# Patient Record
Sex: Female | Born: 1960
Health system: Southern US, Community
[De-identification: ages and names within clinical notes are randomized; demographics above are authoritative.]

## PROBLEM LIST (undated history)

## (undated) DIAGNOSIS — E039 Hypothyroidism, unspecified: Secondary | ICD-10-CM

## (undated) DIAGNOSIS — F32A Depression, unspecified: Secondary | ICD-10-CM

## (undated) DIAGNOSIS — K5792 Diverticulitis of intestine, part unspecified, without perforation or abscess without bleeding: Secondary | ICD-10-CM

## (undated) DIAGNOSIS — F419 Anxiety disorder, unspecified: Secondary | ICD-10-CM

## (undated) DIAGNOSIS — J410 Simple chronic bronchitis: Secondary | ICD-10-CM

## (undated) DIAGNOSIS — R160 Hepatomegaly, not elsewhere classified: Secondary | ICD-10-CM

## (undated) DIAGNOSIS — K760 Fatty (change of) liver, not elsewhere classified: Secondary | ICD-10-CM

## (undated) DIAGNOSIS — N390 Urinary tract infection, site not specified: Secondary | ICD-10-CM

## (undated) DIAGNOSIS — D219 Benign neoplasm of connective and other soft tissue, unspecified: Secondary | ICD-10-CM

## (undated) DIAGNOSIS — N809 Endometriosis, unspecified: Secondary | ICD-10-CM

## (undated) DIAGNOSIS — Z860100 Personal history of colon polyps, unspecified: Secondary | ICD-10-CM

## (undated) DIAGNOSIS — J45909 Unspecified asthma, uncomplicated: Secondary | ICD-10-CM

## (undated) DIAGNOSIS — M1990 Unspecified osteoarthritis, unspecified site: Secondary | ICD-10-CM

## (undated) DIAGNOSIS — I8393 Asymptomatic varicose veins of bilateral lower extremities: Secondary | ICD-10-CM

## (undated) DIAGNOSIS — K219 Gastro-esophageal reflux disease without esophagitis: Secondary | ICD-10-CM

## (undated) DIAGNOSIS — L4050 Arthropathic psoriasis, unspecified: Secondary | ICD-10-CM

## (undated) DIAGNOSIS — F329 Major depressive disorder, single episode, unspecified: Secondary | ICD-10-CM

## (undated) DIAGNOSIS — Z8619 Personal history of other infectious and parasitic diseases: Secondary | ICD-10-CM

## (undated) DIAGNOSIS — K449 Diaphragmatic hernia without obstruction or gangrene: Secondary | ICD-10-CM

## (undated) DIAGNOSIS — I2510 Atherosclerotic heart disease of native coronary artery without angina pectoris: Principal | ICD-10-CM

## (undated) DIAGNOSIS — E079 Disorder of thyroid, unspecified: Secondary | ICD-10-CM

## (undated) DIAGNOSIS — K5730 Diverticulosis of large intestine without perforation or abscess without bleeding: Secondary | ICD-10-CM

## (undated) DIAGNOSIS — Z87440 Personal history of urinary (tract) infections: Secondary | ICD-10-CM

## (undated) DIAGNOSIS — J189 Pneumonia, unspecified organism: Secondary | ICD-10-CM

## (undated) DIAGNOSIS — E559 Vitamin D deficiency, unspecified: Secondary | ICD-10-CM

## (undated) DIAGNOSIS — I739 Peripheral vascular disease, unspecified: Secondary | ICD-10-CM

## (undated) DIAGNOSIS — J449 Chronic obstructive pulmonary disease, unspecified: Secondary | ICD-10-CM

## (undated) DIAGNOSIS — Z8742 Personal history of other diseases of the female genital tract: Secondary | ICD-10-CM

## (undated) DIAGNOSIS — L409 Psoriasis, unspecified: Secondary | ICD-10-CM

## (undated) DIAGNOSIS — K649 Unspecified hemorrhoids: Secondary | ICD-10-CM

## (undated) DIAGNOSIS — K635 Polyp of colon: Secondary | ICD-10-CM

## (undated) DIAGNOSIS — K5790 Diverticulosis of intestine, part unspecified, without perforation or abscess without bleeding: Secondary | ICD-10-CM

## (undated) DIAGNOSIS — Z9289 Personal history of other medical treatment: Secondary | ICD-10-CM

## (undated) DIAGNOSIS — I712 Thoracic aortic aneurysm, without rupture: Secondary | ICD-10-CM

## (undated) DIAGNOSIS — A630 Anogenital (venereal) warts: Secondary | ICD-10-CM

## (undated) DIAGNOSIS — Z8719 Personal history of other diseases of the digestive system: Secondary | ICD-10-CM

## (undated) DIAGNOSIS — C679 Malignant neoplasm of bladder, unspecified: Secondary | ICD-10-CM

## (undated) DIAGNOSIS — K602 Anal fissure, unspecified: Secondary | ICD-10-CM

## (undated) DIAGNOSIS — T7840XA Allergy, unspecified, initial encounter: Secondary | ICD-10-CM

## (undated) HISTORY — PX: SPINE SURGERY: SHX786

## (undated) HISTORY — DX: Hepatomegaly, not elsewhere classified: R16.0

## (undated) HISTORY — DX: Diaphragmatic hernia without obstruction or gangrene: K44.9

## (undated) HISTORY — DX: Major depressive disorder, single episode, unspecified: F32.9

## (undated) HISTORY — DX: Unspecified osteoarthritis, unspecified site: M19.90

## (undated) HISTORY — DX: Gastro-esophageal reflux disease without esophagitis: K21.9

## (undated) HISTORY — DX: Malignant neoplasm of bladder, unspecified: C67.9

## (undated) HISTORY — DX: Allergy, unspecified, initial encounter: T78.40XA

## (undated) HISTORY — DX: Atherosclerotic heart disease of native coronary artery without angina pectoris: I25.10

## (undated) HISTORY — DX: Depression, unspecified: F32.A

## (undated) HISTORY — DX: Fatty (change of) liver, not elsewhere classified: K76.0

## (undated) HISTORY — DX: Pneumonia, unspecified organism: J18.9

## (undated) HISTORY — DX: Anal fissure, unspecified: K60.2

## (undated) HISTORY — PX: TRANSURETHRAL RESECTION OF BLADDER TUMOR: SHX2575

## (undated) HISTORY — DX: Diverticulitis of intestine, part unspecified, without perforation or abscess without bleeding: K57.92

## (undated) HISTORY — DX: Benign neoplasm of connective and other soft tissue, unspecified: D21.9

## (undated) HISTORY — PX: BRONCHOSCOPY: SUR163

## (undated) HISTORY — PX: BLADDER SURGERY: SHX569

## (undated) HISTORY — PX: TONSILLECTOMY: SUR1361

## (undated) HISTORY — DX: Diverticulosis of intestine, part unspecified, without perforation or abscess without bleeding: K57.90

## (undated) HISTORY — DX: Unspecified asthma, uncomplicated: J45.909

## (undated) HISTORY — DX: Anogenital (venereal) warts: A63.0

## (undated) HISTORY — DX: Urinary tract infection, site not specified: N39.0

## (undated) HISTORY — DX: Anxiety disorder, unspecified: F41.9

## (undated) HISTORY — DX: Polyp of colon: K63.5

## (undated) HISTORY — DX: Asymptomatic varicose veins of bilateral lower extremities: I83.93

## (undated) HISTORY — DX: Peripheral vascular disease, unspecified: I73.9

## (undated) HISTORY — DX: Disorder of thyroid, unspecified: E07.9

## (undated) HISTORY — DX: Unspecified hemorrhoids: K64.9

## (undated) HISTORY — PX: COLONOSCOPY: SHX174

## (undated) HISTORY — DX: Endometriosis, unspecified: N80.9

## (undated) HISTORY — DX: Thoracic aortic aneurysm, without rupture: I71.2

---

## 2002-11-24 HISTORY — PX: VAGINAL HYSTERECTOMY: SUR661

## 2003-11-25 HISTORY — PX: LUMBAR SPINE SURGERY: SHX701

## 2008-11-24 HISTORY — PX: ESOPHAGOGASTRODUODENOSCOPY: SHX1529

## 2013-03-01 ENCOUNTER — Encounter (HOSPITAL_COMMUNITY): Payer: Self-pay

## 2013-03-01 ENCOUNTER — Other Ambulatory Visit (HOSPITAL_COMMUNITY): Payer: Self-pay | Admitting: Internal Medicine

## 2013-03-01 ENCOUNTER — Ambulatory Visit (HOSPITAL_COMMUNITY)
Admission: RE | Admit: 2013-03-01 | Discharge: 2013-03-01 | Disposition: A | Discharge status: Home / Self Care | Payer: Managed Care, Other (non HMO) | Source: Ambulatory Visit | Attending: Internal Medicine | Admitting: Internal Medicine

## 2013-03-01 DIAGNOSIS — R52 Pain, unspecified: Secondary | ICD-10-CM

## 2013-03-01 DIAGNOSIS — R109 Unspecified abdominal pain: Secondary | ICD-10-CM | POA: Insufficient documentation

## 2013-03-01 DIAGNOSIS — R935 Abnormal findings on diagnostic imaging of other abdominal regions, including retroperitoneum: Secondary | ICD-10-CM | POA: Insufficient documentation

## 2013-03-01 DIAGNOSIS — Z8551 Personal history of malignant neoplasm of bladder: Secondary | ICD-10-CM | POA: Insufficient documentation

## 2013-03-01 DIAGNOSIS — K7689 Other specified diseases of liver: Secondary | ICD-10-CM | POA: Insufficient documentation

## 2013-03-01 IMAGING — CT CT ABD-PELV W/ CM
2 of 5 series · 17 of 46 positions shown, 19 images · IV contrast (CONTRAST)
Comparison: None.

CLINICAL DATA: Left-sided abdominal pain.  History of bladder
cancer.

CT ABDOMEN AND PELVIS WITH CONTRAST
TECHNIQUE: Multidetector CT imaging of the abdomen and pelvis was
performed following the standard protocol during bolus
administration of intravenous contrast.
Contrast: 100mL OMNIPAQUE IOHEXOL 300 MG/ML  SOLN

[Series 2: routine · axial · 0.79mm/px · z∈[+390,+805]mm · 14 of 93 slices shown, 16 images]
[im 5/93  soft-tissue]
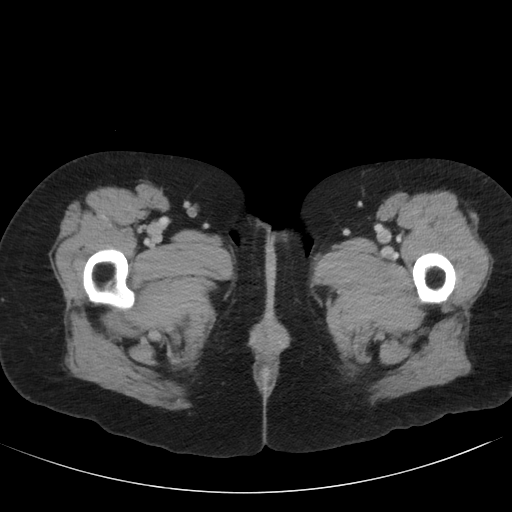
[im 5/93  bone]
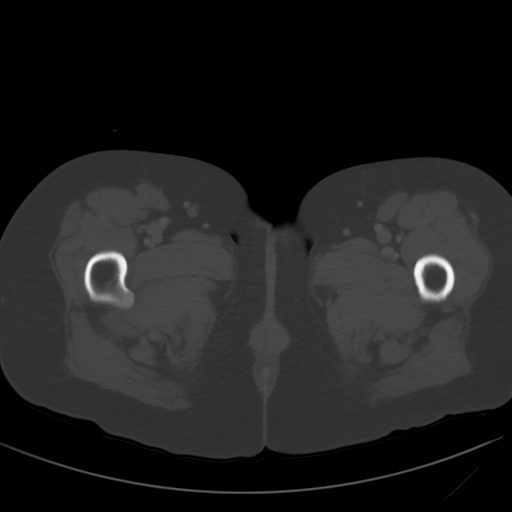
[im 10/93  soft-tissue]
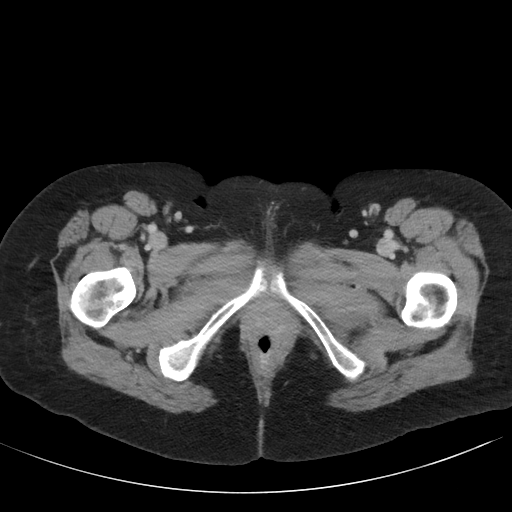
[im 20/93  soft-tissue]
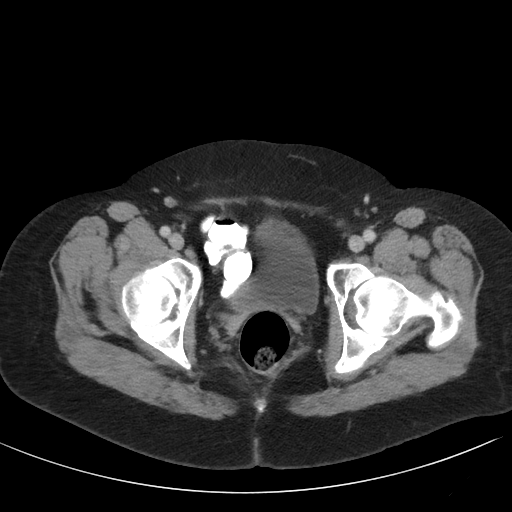
[im 25/93  soft-tissue]
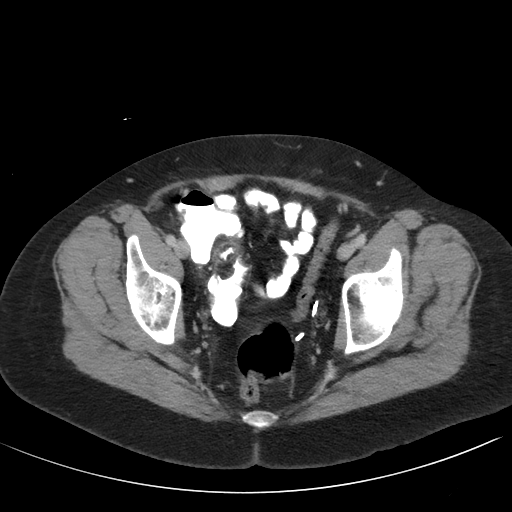
[im 30/93  soft-tissue]
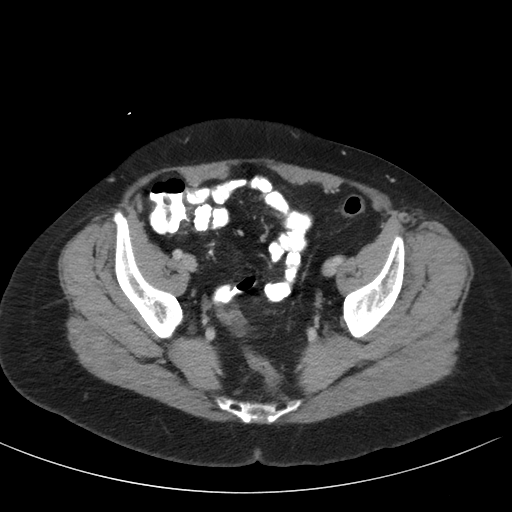
[im 39/93  soft-tissue]
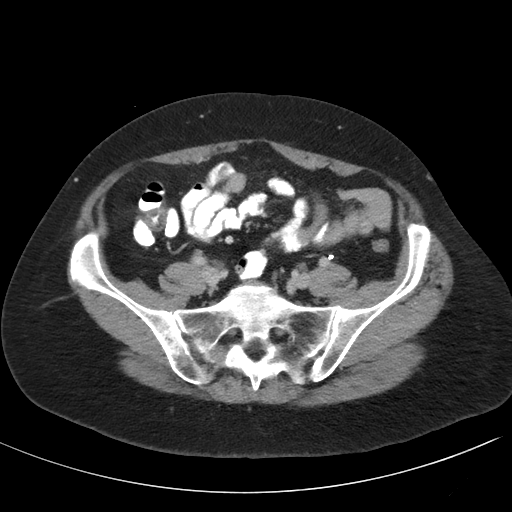
[im 44/93  soft-tissue]
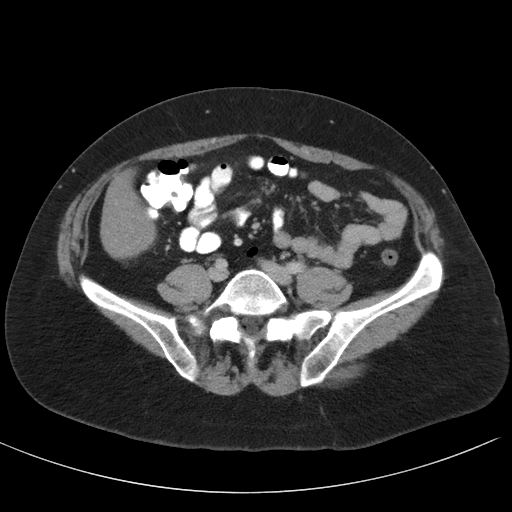
[im 49/93  soft-tissue]
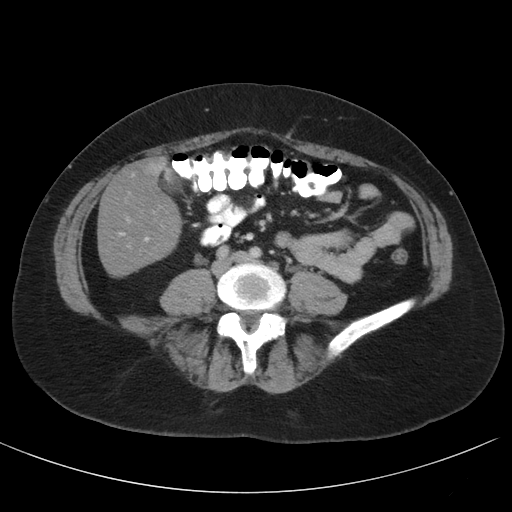
[im 54/93  soft-tissue]
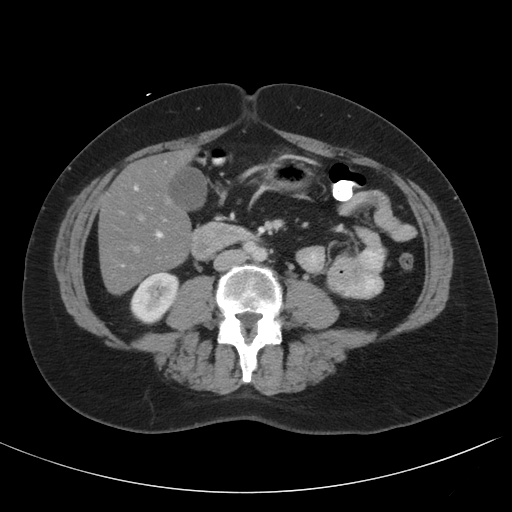
[im 54/93  bone]
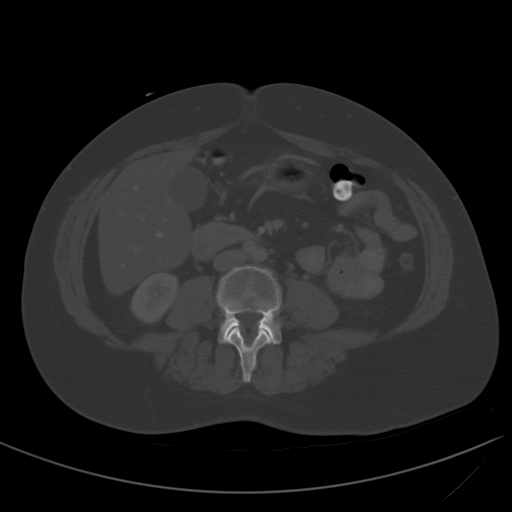
[im 63/93  soft-tissue]
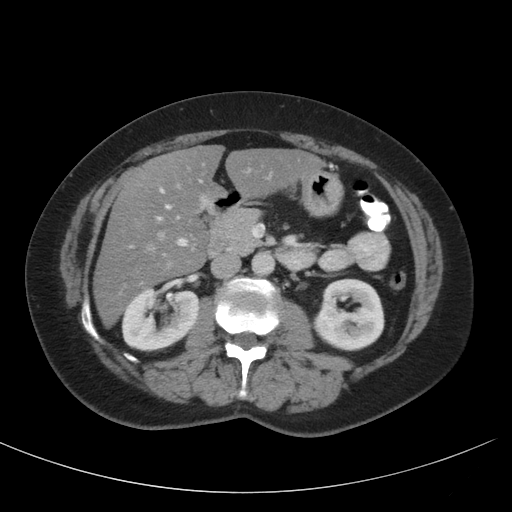
[im 68/93  soft-tissue]
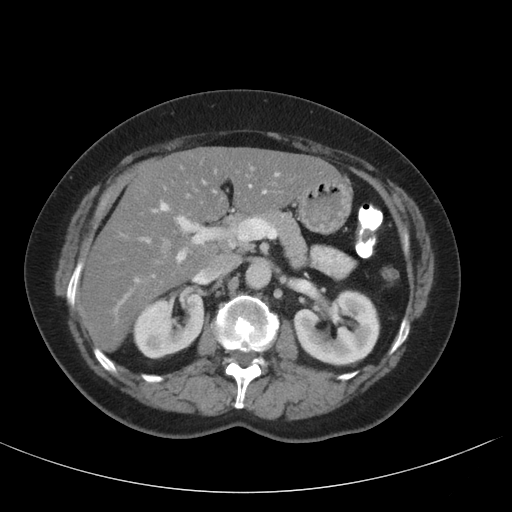
[im 73/93  soft-tissue]
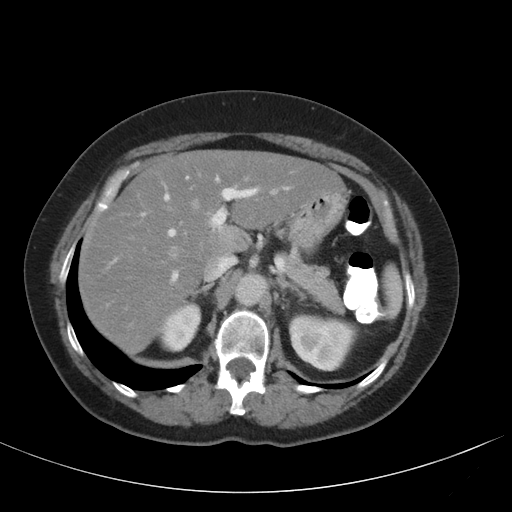
[im 83/93  soft-tissue]
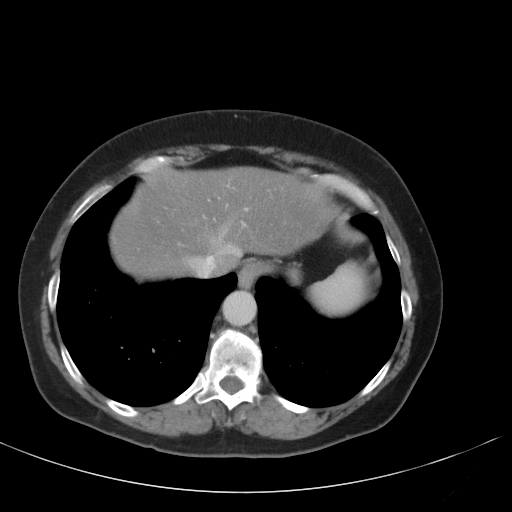
[im 88/93  soft-tissue]
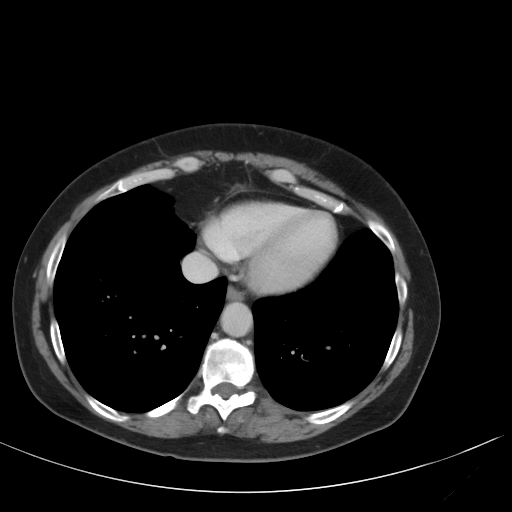

[mpr, coronals, coronal · coronal · 0.90mm/px · 3 of 96 slices shown]
[im 32/96  soft-tissue]
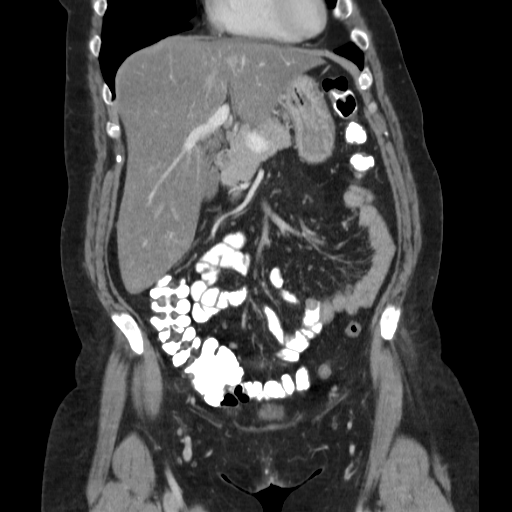
[im 43/96  soft-tissue]
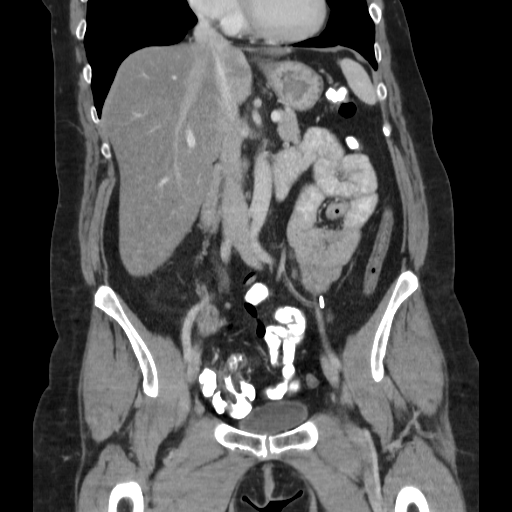
[im 53/96  soft-tissue]
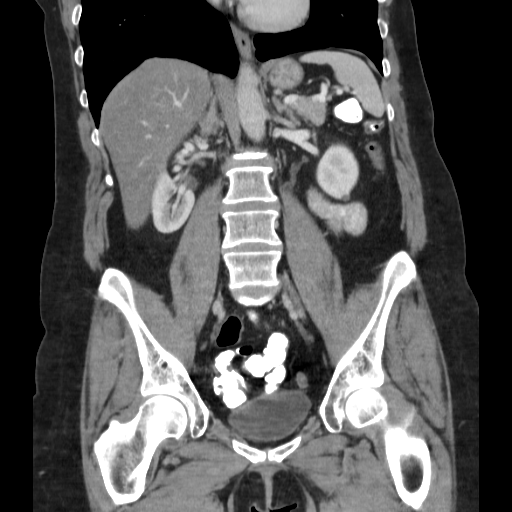

[17 of 46 positions shown; findings below may reference images not displayed]

FINDINGS: The lung bases are clear without focal nodule, mass, or
airspace disease.  The heart size is normal.  No significant
pleural or pericardial effusion is present.

There is diffuse fatty infiltration of the liver.  No discrete
lesions are evident.  The spleen is within normal limits.  The
stomach, duodenum, and pancreas are normal.  The, bowel ducts and
gallbladder within normal limits.  The adrenal glands are normal
bilaterally.  Kidneys and ureters are normal.

Rectosigmoid colon is mostly collapsed.  There is a focal area of
slight wall thickening near the splenic flexure.  Minimal stranding
is evident.  Oral contrast is noted within the more proximal colon.
Focal wall thickening is noted at terminal ileum.  The remainder of
the small bowel is unremarkable.  No significant adenopathy or free
fluid is present.

The bone windows are unremarkable.
IMPRESSION: 1.  Focal wall thickening and fatty infiltration of the terminal
ileum suggests possibility of inflammatory bowel disease.
2.  Focal wall thickening near the splenic flexure of the colon
with some inflammation could represent focal colitis or segmental
disease associated to Crohn's disease.
3.  Diffuse fatty infiltration of the liver.

## 2013-03-01 MED ORDER — IOHEXOL 300 MG/ML  SOLN
100.0000 mL | Freq: Once | INTRAMUSCULAR | Status: AC | PRN
  Administered 2013-03-01: 100 mL via INTRAVENOUS

## 2013-03-02 ENCOUNTER — Encounter: Payer: Self-pay | Admitting: Internal Medicine

## 2013-03-08 ENCOUNTER — Telehealth: Payer: Self-pay | Admitting: Internal Medicine

## 2013-03-08 NOTE — Telephone Encounter (Signed)
Left message for patient to call back  

## 2013-03-09 NOTE — Telephone Encounter (Signed)
Patient would like to see PA or RNP earlier.   She is scheduled with Malachi Bonds to come tomorrow at 10:30

## 2013-03-10 ENCOUNTER — Ambulatory Visit (INDEPENDENT_AMBULATORY_CARE_PROVIDER_SITE_OTHER): Payer: Managed Care, Other (non HMO) | Admitting: Nurse Practitioner

## 2013-03-10 ENCOUNTER — Encounter: Payer: Self-pay | Admitting: Nurse Practitioner

## 2013-03-10 VITALS — BP 120/86 | HR 81 | Ht 69.0 in | Wt 189.0 lb

## 2013-03-10 DIAGNOSIS — R197 Diarrhea, unspecified: Secondary | ICD-10-CM

## 2013-03-10 DIAGNOSIS — R933 Abnormal findings on diagnostic imaging of other parts of digestive tract: Secondary | ICD-10-CM

## 2013-03-10 MED ORDER — METHOCARBAMOL 500 MG PO TABS: 500.0000 mg | ORAL_TABLET | Freq: Two times a day (BID) | ORAL | Status: DC

## 2013-03-10 NOTE — Patient Instructions (Addendum)
You have been scheduled for a CT scan of the abdomen and pelvis at [facility] ([address]---this is in the same building as [facility]).   You are scheduled on 03/31/13  at 9 am. You should arrive 15 minutes prior to your appointment time for registration. Please follow the written instructions below on the day of your exam:  WARNING: IF YOU ARE ALLERGIC TO IODINE/X-RAY DYE, PLEASE NOTIFY RADIOLOGY IMMEDIATELY AT [phone]! YOU WILL BE GIVEN A 13 HOUR PREMEDICATION PREP.  1) Do not eat or drink anything after 5 am (4 hours prior to your test) 2) You have been given 2 bottles of oral contrast to drink. The solution may taste  better if refrigerated, but do NOT add ice or any other liquid to this solution. Shake well before drinking.    Drink 1 bottle of contrast @ 7 am (2 hours prior to your exam)  Drink 1 bottle of contrast @ 8 am (1 hour prior to your exam)  You may take any medications as prescribed with a small amount of water except for the following: Metformin, Glucophage, Glucovance, Avandamet, Riomet, Fortamet, Actoplus Met, Janumet, Glumetza or Metaglip. The above medications must be held the day of the exam AND 48 hours after the exam.  The purpose of you drinking the oral contrast is to aid in the visualization of your intestinal tract. The contrast solution may cause some diarrhea. Before your exam is started, you will be given a small amount of fluid to drink. Depending on your individual set of symptoms, you may also receive an intravenous injection of x-ray contrast/dye. Plan on being at [facility] for 30 minutes or long, depending on the type of exam you are having performed.  This test typically takes 30-45 minutes to complete.  If you have any questions regarding your exam or if you need to reschedule, you may call the CT department at [phone] between the hours of 8:00 am and 5:00 pm,  Monday-Friday.  ________________________________________________________________________ Please follow up with Dr Jarold Motto in 1 month. CC:  Alysia Penna MD

## 2013-03-10 NOTE — Progress Notes (Addendum)
HPI :  Patient is a 52 year old female, new to this practice, who relocated from [place] last summer. She has a [facility] of colon cancer in mother and has had multiple surveillance colonoscopies in the past. Her last colonoscopy, done in [facility] May 2013 for surveillance of polyp history, evaluation of constipation and rectal bleeding was normal. No polyps or masses were seen, only internal hemorrhoids.  I do not have the pathology report of previous colon polyps but that has been requested.   A few weeks ago patient developed diarrhea. She subsequently developed lower back pain after lifting bird seed. Following that she developed nausea and began to notice blood in her stool. Patient thought she had another kidney infection. She saw PCP at [facility], a CTscan with contrast was ordered and revealed a focal area of slight wall thickening near the splenic flexure and focal wall thickening at terminal ileum. PCP started her on Cipro and Flagyl which she will complete tomorrow. CBC was okay except for elevated MCV. She is feeling better., still has back pain but BMs are forming. Her typical bowel pattern is usually 1-2 formed BMs a day. She does occasionally have minimal rectal bleeding which she attributes to hemorrhoids.   Family history of colon cancer in mother diagnosed at 33 years of age.  Past Medical History  Diagnosis Date  . Cancer     bladder  . Anal fissure   . Anxiety   . Colon polyps   . Depression   . Anxiety   . Diverticulosis   . Diverticulitis   . GERD (gastroesophageal reflux disease)   . Thyroid disease   . Pneumonia   . UTI (lower urinary tract infection)    Family History  Problem Relation Age of Onset  . Breast cancer Maternal Grandmother   . Colon cancer Maternal Aunt 50  . Cancer Paternal Aunt     ?   History  Substance Use Topics  . Smoking status: Current Every Day Smoker -- 1.00 packs/day    Types: Cigarettes  . Smokeless tobacco: Never  Used  . Alcohol Use: No   Current Outpatient Prescriptions  Medication Sig Dispense Refill  . cetirizine (ZYRTEC) 10 MG tablet Take 10 mg by mouth daily.      . ciprofloxacin (CIPRO) 500 MG/5ML (10%) suspension Take 500 mg by mouth 2 (two) times daily.      . clonazePAM (KLONOPIN) 0.5 MG tablet Take 0.5 mg by mouth 2 (two) times daily as needed for anxiety.      . Levothyroxine Sodium (SYNTHROID PO) Take by mouth. As directed not sure of the strength      . metroNIDAZOLE (FLAGYL) 500 MG tablet Take 500 mg by mouth 3 (three) times daily.       No current facility-administered medications for this visit.   Allergies  Allergen Reactions  . Chantix (Varenicline) Hives    Rash on breast  . Penicillins Hives  . Sulfa Antibiotics Hives    Review of Systems: All systems reviewed and negative except where noted in HPI.    Physical Exam: BP 120/86  Pulse 81  Ht 5\' 9"  (1.753 m)  Wt 189 lb (85.73 kg)  BMI 27.9 kg/m2 Constitutional: Well-developed, white female in no acute distress. HEENT: Normocephalic and atraumatic. Conjunctivae are normal. No scleral icterus. Neck supple.  Cardiovascular: Normal rate, regular rhythm.  Pulmonary/chest: Effort normal and breath sounds normal. No wheezing, rales or rhonchi. Abdominal: Soft, nondistended, nontender. Bowel sounds active throughout. There  are no masses palpable. No hepatomegaly. Extremities: no edema Lymphadenopathy: No cervical adenopathy noted. Neurological: Alert and oriented to person place and time. Skin: Skin is warm and dry. No rashes noted. Psychiatric: Normal mood and affect. Behavior is normal.   ASSESSMENT AND PLAN:  1. Acute illness with nausea, diarrhea and left side / left lower back pain. CTscan reveals focal area of slight wall thickening near the splenic flexure and focal wall thickening at terminal ileum. Diarrhea resolving with antibiotics. Her left lower back / side pain may be musculoskeletal after picking up  birdseed. Will try her on a low dose muscle relaxer so see if this helps. She is just 11 months out from a normal colonoscopy. Suspect infectious process which is resolving. Will repeat CTscan in 3 weeks. .  .  2. [facility] of colon cancer in mother at age 63. Patient had a normal surveillance colonoscopy in [place] 11 months ago. Report to be scanned.   3. History of colon polyps. We called two different endoscopy centers today to get get path results but no one seems to have them. Patient will attempt to get path reports herself  4. GERD, her last EGD date is unknown. She has only occasional symptoms so not on daily PPI.   5. Fatty liver disease  Addendum 03/17/12:   Records received from previous gastroenterology group. Briefly, patient had   colonoscopy with polypectomy January 2003. Pathology compatible with tubular adenoma.  Colonoscopy August 2005 no polyps. Hemorrhoids and proctitis found. Biopsies compatible with mild chronic nonspecific proctitis.  Colonoscopy with biopsy December 2006 with findings of mild lower proctitis. Rectal biopsies negative.  EGD with biopsy December 2006 for abdominal pain. Exam normal antum  Biopsies revealed mild chronic gastritis without H. Pylori.  EGD with biopsy January 2009 with findings of thickened gastric folds. Biopsies were negative.  Colonoscopy March 2010 done for rectal bleeding and" history of colon cancer". Of note, the patient's mother had colon cancer, patient has no personal history of colon cancer. No polyps or masses seen on this colonoscopy.  Colonoscopy may 2013 for constipation rectal bleeding. The extent of exam to the cecum. Internal hemorrhoids were not bleeding nor inflamed. No polyps seen. Exam normal.  Transurethral resection of bladder tumor November 2003. 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  Records to be scanned in media

## 2013-03-13 ENCOUNTER — Encounter: Payer: Self-pay | Admitting: Nurse Practitioner

## 2013-03-14 ENCOUNTER — Encounter: Payer: Self-pay | Admitting: *Deleted

## 2013-03-14 ENCOUNTER — Telehealth: Payer: Self-pay | Admitting: Nurse Practitioner

## 2013-03-15 NOTE — Telephone Encounter (Signed)
Pt called to let us know that her diarrhea has come back. Pt wanted to know if it was ok for her to continue taking align and wanted to know if she could take pepto. Discussed with pt that it is fine for her to take the align and that we recommend taking Imodium for diarrhea. Pt just wanted to let us know that the diarrhea did come back. Willette Cluster NP notified.

## 2013-03-31 ENCOUNTER — Ambulatory Visit (INDEPENDENT_AMBULATORY_CARE_PROVIDER_SITE_OTHER)
Admission: RE | Admit: 2013-03-31 | Discharge: 2013-03-31 | Disposition: A | Discharge status: Home / Self Care | Payer: Managed Care, Other (non HMO) | Source: Ambulatory Visit | Attending: Nurse Practitioner | Admitting: Nurse Practitioner

## 2013-03-31 DIAGNOSIS — R933 Abnormal findings on diagnostic imaging of other parts of digestive tract: Secondary | ICD-10-CM

## 2013-03-31 DIAGNOSIS — R197 Diarrhea, unspecified: Secondary | ICD-10-CM

## 2013-03-31 MED ORDER — IOHEXOL 300 MG/ML  SOLN
100.0000 mL | Freq: Once | INTRAMUSCULAR | Status: AC | PRN
  Administered 2013-03-31: 100 mL via INTRAVENOUS

## 2013-04-04 ENCOUNTER — Ambulatory Visit: Payer: Managed Care, Other (non HMO) | Admitting: Internal Medicine

## 2013-04-06 ENCOUNTER — Other Ambulatory Visit: Payer: Self-pay | Admitting: Internal Medicine

## 2013-04-06 DIAGNOSIS — Z1231 Encounter for screening mammogram for malignant neoplasm of breast: Secondary | ICD-10-CM

## 2013-04-07 ENCOUNTER — Encounter: Payer: Self-pay | Admitting: *Deleted

## 2013-04-12 ENCOUNTER — Encounter: Payer: Self-pay | Admitting: Gastroenterology

## 2013-04-12 ENCOUNTER — Ambulatory Visit (INDEPENDENT_AMBULATORY_CARE_PROVIDER_SITE_OTHER): Payer: Managed Care, Other (non HMO) | Admitting: Gastroenterology

## 2013-04-12 VITALS — BP 120/90 | HR 90 | Ht 69.0 in | Wt 189.0 lb

## 2013-04-12 DIAGNOSIS — R1012 Left upper quadrant pain: Secondary | ICD-10-CM

## 2013-04-12 DIAGNOSIS — R197 Diarrhea, unspecified: Secondary | ICD-10-CM

## 2013-04-12 DIAGNOSIS — Z860100 Personal history of colon polyps, unspecified: Secondary | ICD-10-CM

## 2013-04-12 DIAGNOSIS — R52 Pain, unspecified: Secondary | ICD-10-CM

## 2013-04-12 NOTE — Patient Instructions (Signed)
Your physician has requested that you go to the basement for the following lab work before leaving today: IFOB test

## 2013-04-12 NOTE — Progress Notes (Signed)
This is a 52 year old Caucasian female status post episode of severe left upper quadrant pain radiating into her back with associated diarrhea treated by primary care physician with Cipro and metronidazole.  CT scan showed thickening of her splenic flexure of the colon, but otherwise was unremarkable.  She is status post multiple colonoscopies in [place], and is up-to-date on her colonoscopy exams.  She has multiple urologic problems and had cystoscopy 1 month before her most recent episode of abdominal pain which currently with several weeks ago.  At this time she is free of any pain, rectal bleeding, or bowel or regularity.  She denies any systemic complaints, use of narcotics, but is a constricted medications, or any episodes of dehydration.  Allegedly, she has been treated in the past for diverticulitis.  Current Medications, Allergies, Past Medical History, Past Surgical History, Family History and Social History were reviewed in [employer] record.  ROS: All systems were reviewed and are negative unless otherwise stated in the HPI.          Physical Exam: The appearing patient in no distress.  Blood pressure and 30/66, pulse 67 and regular and weight 161.  Her abdomen shows some obesity but no distention, organomegaly, masses or tenderness.  Bowel sounds are normal.  There is no CVA tenderness.  Mental status is normal    Assessment and Plan: Probable resolved ischemic colitis, etiology unclear.  I do not think this patient has underlying inflammatory bowel disease, it is unlikely that she had diverticulitis with such an unusual acute presentation.  I've asked her to do IFOB stool cards, and to call us immediately if she has recurrence of her problem that would require followup colonoscopy.  Followup CT scan on May 8 this year a completely cleared as opposed his CT scan on April 8.  Scans reviewed and do show fatty infiltration of the liver is some  atherogenesis. Encounter Diagnosis  Name Primary?  . Diarrhea Yes

## 2013-04-21 ENCOUNTER — Other Ambulatory Visit (HOSPITAL_COMMUNITY): Payer: Managed Care, Other (non HMO)

## 2013-04-26 ENCOUNTER — Ambulatory Visit (HOSPITAL_COMMUNITY): Payer: Managed Care, Other (non HMO) | Attending: Cardiology | Admitting: Radiology

## 2013-04-26 ENCOUNTER — Other Ambulatory Visit (HOSPITAL_COMMUNITY): Payer: Self-pay | Admitting: Internal Medicine

## 2013-04-26 ENCOUNTER — Other Ambulatory Visit (INDEPENDENT_AMBULATORY_CARE_PROVIDER_SITE_OTHER): Payer: Managed Care, Other (non HMO)

## 2013-04-26 DIAGNOSIS — I712 Thoracic aortic aneurysm, without rupture, unspecified: Secondary | ICD-10-CM

## 2013-04-26 DIAGNOSIS — R197 Diarrhea, unspecified: Secondary | ICD-10-CM

## 2013-04-26 NOTE — Progress Notes (Signed)
Echocardiogram performed.  

## 2013-04-27 ENCOUNTER — Encounter (HOSPITAL_COMMUNITY): Payer: Self-pay | Admitting: Internal Medicine

## 2013-05-11 ENCOUNTER — Ambulatory Visit
Admission: RE | Admit: 2013-05-11 | Discharge: 2013-05-11 | Disposition: A | Discharge status: Home / Self Care | Payer: Managed Care, Other (non HMO) | Source: Ambulatory Visit | Attending: Internal Medicine | Admitting: Internal Medicine

## 2013-05-11 DIAGNOSIS — Z1231 Encounter for screening mammogram for malignant neoplasm of breast: Secondary | ICD-10-CM

## 2013-10-18 ENCOUNTER — Other Ambulatory Visit: Payer: Self-pay | Admitting: Internal Medicine

## 2013-10-18 DIAGNOSIS — R7989 Other specified abnormal findings of blood chemistry: Secondary | ICD-10-CM

## 2013-10-26 ENCOUNTER — Ambulatory Visit
Admission: RE | Admit: 2013-10-26 | Discharge: 2013-10-26 | Disposition: A | Discharge status: Home / Self Care | Payer: BC Managed Care – PPO | Source: Ambulatory Visit | Attending: Internal Medicine | Admitting: Internal Medicine

## 2013-10-26 DIAGNOSIS — R7989 Other specified abnormal findings of blood chemistry: Secondary | ICD-10-CM

## 2013-11-09 ENCOUNTER — Encounter: Payer: Self-pay | Admitting: Certified Nurse Midwife

## 2013-11-09 ENCOUNTER — Ambulatory Visit (INDEPENDENT_AMBULATORY_CARE_PROVIDER_SITE_OTHER): Payer: BC Managed Care – PPO | Admitting: Certified Nurse Midwife

## 2013-11-09 VITALS — BP 110/70 | HR 72 | Resp 16 | Ht 68.25 in | Wt 190.0 lb

## 2013-11-09 DIAGNOSIS — Z01419 Encounter for gynecological examination (general) (routine) without abnormal findings: Secondary | ICD-10-CM

## 2013-11-09 DIAGNOSIS — Z8551 Personal history of malignant neoplasm of bladder: Secondary | ICD-10-CM

## 2013-11-09 DIAGNOSIS — Z0000 Encounter for general adult medical examination without abnormal findings: Secondary | ICD-10-CM

## 2013-11-09 LAB — POCT URINALYSIS DIPSTICK
Bilirubin, UA: NEGATIVE
Blood, UA: NEGATIVE
Glucose, UA: NEGATIVE
Ketones, UA: NEGATIVE
Urobilinogen, UA: NEGATIVE

## 2013-11-09 NOTE — Progress Notes (Addendum)
52 y.o. [id number] Married Caucasian Fe here to establish gyn care and  for annual exam. Menopausal no HRT. Vaginal hysterectomy in 2004 due to fibroids and bleeding one ovary retained. History of bladder cancer, resolved with susrgery. Occasional hot flash or night sweats. Some vaginal dryness use OTC without problems, limited sexually active, spouse disabled no problems. Patient complaining of rash external vaginal area and inside groin area. PCP suggested she use her steroid cream which is helping.Denies new personal products. Patient did walk with tight pants for several area on trip to [place]. Denies any other problems today.  Patient's last menstrual period was 11/24/2002.          Sexually active: yes  The current method of family planning is status post hysterectomy.    Exercising: no  exercise Smoker:  yes  Health Maintenance: Pap: 3/13 no abnormal pap smears MMG: 10/14 negative Colonoscopy: 4/13 negative repeat 3 years BMD: 2009 normal TDaP: 10/13 Labs: Poct urine-neg Self breast exam: not done   reports that she has been smoking Cigarettes.  She has been smoking about 1.00 pack per day. She has never used smokeless tobacco. She reports that she does not drink alcohol or use illicit drugs.  Past Medical History  Diagnosis Date  . Bladder cancer   . Anal fissure   . Colon polyps     TUBULAR ADENOMA   . Depression   . Anxiety   . Diverticulosis   . Diverticulitis   . GERD (gastroesophageal reflux disease)   . Thyroid disease   . Pneumonia   . UTI (lower urinary tract infection)   . Asthma   . Fatty liver   . Endometriosis   . Fibroid   . Genital warts     Past Surgical History  Procedure Laterality Date  . Cesarean section    . Spine surgery    . Tonsillectomy    . Vaginal hysterectomy  2004  . Bronchoscopy      Current Outpatient Prescriptions  Medication Sig Dispense Refill  . Betamethasone Valerate 0.12 % foam as needed.      .  calcipotriene-betamethasone (TACLONEX) ointment as needed.      . cefdinir (OMNICEF) 300 MG capsule daily.      . cetirizine (ZYRTEC) 10 MG tablet Take 10 mg by mouth daily.      . Cholecalciferol (VITAMIN D-3 PO) Take 1 tablet by mouth daily.      . clonazePAM (KLONOPIN) 0.5 MG tablet Take 0.5 mg by mouth 2 (two) times daily as needed for anxiety.      . Cyanocobalamin (VITAMIN B-12 CR PO) Take 1 tablet by mouth daily.      Marland Kitchen levothyroxine (SYNTHROID, LEVOTHROID) 75 MCG tablet Take 75 mcg by mouth daily before breakfast.      . ondansetron (ZOFRAN) 4 MG tablet as needed.      Marland Kitchen PROAIR HFA 108 (90 BASE) MCG/ACT inhaler as needed.      . zolpidem (AMBIEN) 10 MG tablet as needed.       No current facility-administered medications for this visit.    Family History  Problem Relation Age of Onset  . Breast cancer Maternal Grandmother   . Stroke Maternal Grandmother   . Colon cancer Mother   . Cancer Mother     liver  . Hypertension Father   . Thyroid disease Father   . Heart attack Maternal Grandfather     ROS:  Pertinent items are noted in HPI.  Otherwise, a comprehensive  ROS was negative.  Exam:   BP 110/70  Pulse 72  Resp 16  Ht 5' 8.25" (1.734 m)  Wt 190 lb (86.183 kg)  BMI 28.66 kg/m2  LMP 11/24/2002 Height: 5' 8.25" (173.4 cm)  Ht Readings from Last 3 Encounters:  11/09/13 5' 8.25" (1.734 m)  04/12/13 5\' 9"  (1.753 m)  03/10/13 5\' 9"  (1.753 m)    General appearance: alert, cooperative and appears stated age Head: Normocephalic, without obvious abnormality, atraumatic Neck: no adenopathy, supple, symmetrical, trachea midline and thyroid normal to inspection and palpation and non-palpable Lungs: clear to auscultation bilaterally Breasts: normal appearance, no masses or tenderness, No nipple retraction or dimpling, No nipple discharge or bleeding, No axillary or supraclavicular adenopathy Heart: regular rate and rhythm Abdomen: soft, non-tender; no masses,  no  organomegaly Extremities: extremities normal, atraumatic, no cyanosis or edema Skin: Skin color, texture, turgor normal. No rashes or lesions Lymph nodes: Cervical, supraclavicular, and axillary nodes normal. No abnormal inguinal nodes palpated Neurologic: Grossly normal   Pelvic: External genitalia:  no lesions, no redness or tenderness  Inside creases to groin and inner thighs resolving dry skin appearance, slightly pink, non tender, no lesions.              Urethra:  normal appearing urethra with no masses, tenderness or lesions              Bartholin's and Skene's: normal                 Vagina: normal appearing vagina with normal color and discharge, no lesions              Cervix: absent              Pap taken: yes patient request ( patient given current guidelines, but had history of genital warts) Bimanual Exam:  Uterus:  uterus absent              Adnexa: normal adnexa and no mass, fullness, tenderness               Rectovaginal: Confirms               Anus:  normal sphincter tone, no lesions  A:  Well Woman with normal exam  Menopausal no HRT s/p TVH with ovaries retained due to fibroids, bleeding  History of bladder cancer, resolved with surgery  History of aortic aneurysm under follow up  History of genital warts, no abnormal pap smears  Hypothyroid stable medication with PCP management  Dry skin on inner thighs resolving P:   Reviewed health and wellness pertinent to exam  Discussed if notes return of genital warts or unusual appearance on vulva needs OV  Continue follow up as indicated  Pap smear as per guidelines   Mammogram yearly pap smear taken today with reflex counseled on breast self exam, mammography screening, adequate intake of calcium and vitamin D, diet and exercise  return annually or prn  An After Visit Summary was printed and given to the patient.   40 minutes spent with patient with >50% of time spent in face to face counseling.

## 2013-11-09 NOTE — Patient Instructions (Signed)

## 2013-11-10 NOTE — Progress Notes (Signed)
Reviewed personally.  M. Suzanne Victorian Gunn, MD.  

## 2013-11-10 NOTE — Addendum Note (Signed)
Addended by: Verner Chol on: 11/10/2013 05:27 PM   Modules accepted: Level of Service

## 2013-11-10 NOTE — Progress Notes (Signed)
Reviewed personally.  M. Suzanne Conya Ellinwood, MD.  

## 2013-12-05 LAB — IPS PAP TEST WITH REFLEX TO HPV

## 2014-01-24 ENCOUNTER — Other Ambulatory Visit: Payer: Self-pay | Admitting: Internal Medicine

## 2014-01-24 DIAGNOSIS — R911 Solitary pulmonary nodule: Secondary | ICD-10-CM

## 2014-01-24 DIAGNOSIS — I712 Thoracic aortic aneurysm, without rupture, unspecified: Secondary | ICD-10-CM

## 2014-02-01 ENCOUNTER — Ambulatory Visit
Admission: RE | Admit: 2014-02-01 | Discharge: 2014-02-01 | Disposition: A | Discharge status: Home / Self Care | Payer: BC Managed Care – PPO | Source: Ambulatory Visit | Attending: Internal Medicine | Admitting: Internal Medicine

## 2014-02-01 DIAGNOSIS — R911 Solitary pulmonary nodule: Secondary | ICD-10-CM

## 2014-02-01 DIAGNOSIS — I712 Thoracic aortic aneurysm, without rupture, unspecified: Secondary | ICD-10-CM

## 2014-02-01 MED ORDER — IOHEXOL 350 MG/ML SOLN
80.0000 mL | Freq: Once | INTRAVENOUS | Status: AC | PRN
  Administered 2014-02-01: 80 mL via INTRAVENOUS

## 2014-03-27 ENCOUNTER — Ambulatory Visit (HOSPITAL_COMMUNITY)
Admission: RE | Admit: 2014-03-27 | Discharge: 2014-03-27 | Disposition: A | Discharge status: Home / Self Care | Payer: BC Managed Care – PPO | Source: Ambulatory Visit | Attending: Surgery | Admitting: Surgery

## 2014-03-27 ENCOUNTER — Other Ambulatory Visit (HOSPITAL_COMMUNITY): Payer: Self-pay | Admitting: Internal Medicine

## 2014-03-27 DIAGNOSIS — M79609 Pain in unspecified limb: Secondary | ICD-10-CM | POA: Insufficient documentation

## 2014-03-27 NOTE — Progress Notes (Signed)
Preliminary report given verbally to Dr. Ardeth Perfect at [facility]

## 2014-05-25 ENCOUNTER — Other Ambulatory Visit: Payer: Self-pay

## 2014-05-25 DIAGNOSIS — Z1231 Encounter for screening mammogram for malignant neoplasm of breast: Secondary | ICD-10-CM

## 2014-06-07 ENCOUNTER — Ambulatory Visit
Admission: RE | Admit: 2014-06-07 | Discharge: 2014-06-07 | Disposition: A | Discharge status: Home / Self Care | Payer: BC Managed Care – PPO | Source: Ambulatory Visit

## 2014-06-07 DIAGNOSIS — Z1231 Encounter for screening mammogram for malignant neoplasm of breast: Secondary | ICD-10-CM

## 2014-09-20 ENCOUNTER — Telehealth: Payer: Self-pay | Admitting: Certified Nurse Midwife

## 2014-09-20 NOTE — Telephone Encounter (Signed)
Pt states she has been having bleeding or something when she uses the bathroom pt also states it feels like it is pinching. Has been going on for about 4 or 5 days. Went to pcp and was told to come here

## 2014-09-20 NOTE — Telephone Encounter (Signed)
Patient states that she began to notice "a dark color when I wipe. It almost looks like brown old blood. I have had a hysterectomy but I have one ovary." Patient was seen at PCP who recommended that patient be seen here with Felicia Leonard CNM. States that she has also been having "pressure at the entrance of my vagina and some lower back back on the right side." Patient was checked for UTI at PCP and states "There was some trace blood but he told me there were no signs of an infection of the bladder or kidney. I am going to see a bladder doctor as well as seeing Debbi though just in case it is not vaginal." States this began five days ago. Appointment scheduled for tomorrow at 11:15am with Felicia Leonard CNM. Patient is agreeable to date and time.   Routing to provider for final review. Patient agreeable to disposition. Will close encounter

## 2014-09-21 ENCOUNTER — Ambulatory Visit (INDEPENDENT_AMBULATORY_CARE_PROVIDER_SITE_OTHER): Payer: BC Managed Care – PPO | Admitting: Certified Nurse Midwife

## 2014-09-21 ENCOUNTER — Encounter: Payer: Self-pay | Admitting: Certified Nurse Midwife

## 2014-09-21 VITALS — BP 106/70 | HR 70 | Resp 16 | Ht 68.25 in | Wt 190.0 lb

## 2014-09-21 DIAGNOSIS — B379 Candidiasis, unspecified: Secondary | ICD-10-CM

## 2014-09-21 DIAGNOSIS — N952 Postmenopausal atrophic vaginitis: Secondary | ICD-10-CM

## 2014-09-21 MED ORDER — TERCONAZOLE 0.4 % VA CREA: 1.0000 | TOPICAL_CREAM | Freq: Every day | VAGINAL | Status: DC

## 2014-09-21 NOTE — Progress Notes (Signed)
53 y.o.married white female g2p2002 here with complaint of vaginal symptoms of increase discharge with brown color on tissue.  Sexual activity was painful and noted this after occurrence 2 weeks ago. Describes discharge as clear to brown. Patient was not sure it was coming from vagina or urine,History of bladder cancer, had urine check and culture with PCP 2 days ago Normal.Onset of symptoms 5 days ago. Denies new personal products. Has noted some vaginal dryness and has not used any products to help with. No STD concerns. Urinary symptoms none that have changed. Marland Kitchen  O:Healthy female WDWN Affect: normal, orientation x 3  Exam: Abdomen:soft, non tender Lymph node: no enlargement or tenderness Pelvic exam: External genital:  BUS: negative Vagina: scant whited discharge noted. Ph:4.5   ,Wet prep taken, no blood noted. Tissue atrophic appearance, with thinning noted Cervix: absent Uterus: absent Adnexa:not palpable, no masses or fullness, non tender  Wet Prep results:positive for yeast  A:Yeast vaginitis Atrophic vaginitis History of bladder cancer   P:Discussed findings of yeast vaginitis and etiology. Discussed Aveeno  sitz bath for comfort. Discussed vaginal dryness can be causative agent with decrease normal flora in vagina. Questions addressed Rx: Terazol 7 see order with instructions Discussed vaginal dryness and treatment options. Patient prefers OTC trial. Patient will start treatment once she has completed Terazol use. She will advise if there is no change and schedule OV.  Keep appointment with urology for follow up.  Rv prn, aex

## 2014-09-21 NOTE — Progress Notes (Signed)
Reviewed personally.  M. Suzanne Dandre Sisler, MD.  

## 2014-09-21 NOTE — Patient Instructions (Signed)

## 2014-09-25 ENCOUNTER — Encounter: Payer: Self-pay | Admitting: Certified Nurse Midwife

## 2014-10-05 ENCOUNTER — Other Ambulatory Visit (INDEPENDENT_AMBULATORY_CARE_PROVIDER_SITE_OTHER): Payer: BC Managed Care – PPO

## 2014-10-05 ENCOUNTER — Encounter: Payer: Self-pay | Admitting: Nurse Practitioner

## 2014-10-05 ENCOUNTER — Ambulatory Visit (INDEPENDENT_AMBULATORY_CARE_PROVIDER_SITE_OTHER): Payer: BC Managed Care – PPO | Admitting: Nurse Practitioner

## 2014-10-05 VITALS — BP 110/78 | HR 72 | Ht 68.25 in | Wt 188.8 lb

## 2014-10-05 DIAGNOSIS — R109 Unspecified abdominal pain: Secondary | ICD-10-CM

## 2014-10-05 DIAGNOSIS — G8929 Other chronic pain: Secondary | ICD-10-CM

## 2014-10-05 DIAGNOSIS — K625 Hemorrhage of anus and rectum: Secondary | ICD-10-CM

## 2014-10-05 DIAGNOSIS — R194 Change in bowel habit: Secondary | ICD-10-CM

## 2014-10-05 LAB — CBC WITH DIFFERENTIAL/PLATELET
Basophils Absolute: 0.1 10*3/uL (ref 0.0–0.1)
Basophils Relative: 0.8 % (ref 0.0–3.0)
Eosinophils Absolute: 0.2 10*3/uL (ref 0.0–0.7)
Eosinophils Relative: 2.1 % (ref 0.0–5.0)
HCT: 45.1 % (ref 36.0–46.0)
Hemoglobin: 15.3 g/dL — ABNORMAL HIGH (ref 12.0–15.0)
Lymphocytes Relative: 32.1 % (ref 12.0–46.0)
Lymphs Abs: 2.7 10*3/uL (ref 0.7–4.0)
MCHC: 33.9 g/dL (ref 30.0–36.0)
MCV: 89.3 fl (ref 78.0–100.0)
Monocytes Absolute: 0.6 10*3/uL (ref 0.1–1.0)
Monocytes Relative: 7.2 % (ref 3.0–12.0)
Neutro Abs: 4.8 10*3/uL (ref 1.4–7.7)
Neutrophils Relative %: 57.8 % (ref 43.0–77.0)
Platelets: 234 10*3/uL (ref 150.0–400.0)
RBC: 5.05 Mil/uL (ref 3.87–5.11)
RDW: 12.8 % (ref 11.5–15.5)
WBC: 8.4 10*3/uL (ref 4.0–10.5)

## 2014-10-05 LAB — COMPREHENSIVE METABOLIC PANEL
ALT: 49 U/L — AB (ref 0–35)
AST: 37 U/L (ref 0–37)
Albumin: 3.7 g/dL (ref 3.5–5.2)
Alkaline Phosphatase: 85 U/L (ref 39–117)
BUN: 10 mg/dL (ref 6–23)
CALCIUM: 9.7 mg/dL (ref 8.4–10.5)
CHLORIDE: 106 meq/L (ref 96–112)
CO2: 19 mEq/L (ref 19–32)
Creatinine, Ser: 0.8 mg/dL (ref 0.4–1.2)
GFR: 85.94 mL/min (ref >60.00)
Glucose, Bld: 98 mg/dL (ref 70–99)
Potassium: 4.5 mEq/L (ref 3.5–5.1)
SODIUM: 139 meq/L (ref 135–145)
TOTAL PROTEIN: 7.4 g/dL (ref 6.0–8.3)
Total Bilirubin: 0.5 mg/dL (ref 0.2–1.2)

## 2014-10-05 LAB — TSH: TSH: 0.08 u[IU]/mL — ABNORMAL LOW (ref 0.35–4.50)

## 2014-10-05 NOTE — Progress Notes (Signed)
     History of Present Illness:   Patient is a 53 year old female, former patient of Dr. Sharlett Iles. We had seen patient in April 2014 for evaluation of left upper quadrant pain/ diarrhea in setting of focal colitis on CT scan. Patient was treated with antibiotics, follow-up CT scan May 2014 showed resolution of inflammation. Patient's mother died of colon cancer in her 3's. Patient's  last colonoscopy was done in [place] May 2013 with findings of internal hemorrhoids.  Patient is referred today for evaluation of rectal bleeding. A few weeks back patient began seeing blood when she wiped after bowel movements. Patient initially thought bleeding could be vaginal, she saw her gynecologist and workup was apparently negative. Patient went to see her primary care physician, urinalysis showed trace blood. Patient was no longer passing blood at the time she saw her PCP but was sent home with Hemoccult cards which were positive x3. Since that time patient's stools have been discolored (red) which she has attributed to eating beets. Patient is not having any diarrhea but her stools have increased in frequency and she has associated urgency. Prior to a few weeks ago patient was having anywhere from 1-3 formed bowel movements a day. Currently she's having 3-6 soft stools a day. Her urgency has been associated with some incontinence. Weight is stable. Her thyroid medications were changed a few months back. No recent antibiotics.  No dietary changes.   Current Medications, Allergies, Past Medical History, Past Surgical History, Family History and Social History were reviewed in [employer] record.  Physical Exam: General: Pleasant, well developed , white female in no acute distress Head: Normocephalic and atraumatic Eyes:  sclerae anicteric, conjunctiva pink  Ears: Normal auditory acuity Lungs: Clear throughout to auscultation Heart: Regular rate and rhythm Abdomen: Soft, non distended,  non-tender. No masses, no hepatomegaly. Normal bowel sounds Rectal: No stool in vault. Gloved finger heme negative.  Musculoskeletal: Symmetrical with no gross deformities  Extremities: No edema  Neurological: Alert oriented x 4, grossly nonfocal Psychological:  Alert and cooperative. Normal mood and affect  Assessment and Recommendations:  39.  53 year old female with recent development of increased frequency of stools / urgency / recent rectal bleeding.     Thyroid meds were adjusted a few months back, will check TSH.   If patient develops diarrhea then she will need stool studies.   Bleeding probably perianal in nature but given bowel changes will schedule for sigmoidoscopy for further evaluation. The risks, benefits, and alternatives to sigmoidoscopy with possible biopsy and possible polypectomy were discussed with the patient and she consents to proceed.  Check basic labs today   2. [facility] of colon cancer. Mother died in her 71's with colon cancer. Patient is up to date on surveillance colonoscopy. Normal colonoscopy May 2013. Probably needs repeat exam May 2016 but will defer to Dr. Henrene Pastor who patient wants to be her gastroenterologist since Dr. Sharlett Iles has retired.      Marland Kitchen

## 2014-10-05 NOTE — Patient Instructions (Signed)
You have been scheduled for a flexible sigmoidoscopy. Please follow the written instructions given to you at your visit today. If you use inhalers (even only as needed), please bring them with you on the day of your procedure.  Your physician has requested that you go to the basement for the following lab work before leaving today: TSH CBC CMET

## 2014-10-09 ENCOUNTER — Encounter: Payer: Self-pay | Admitting: Nurse Practitioner

## 2014-10-09 DIAGNOSIS — K625 Hemorrhage of anus and rectum: Secondary | ICD-10-CM | POA: Insufficient documentation

## 2014-10-09 DIAGNOSIS — R194 Change in bowel habit: Secondary | ICD-10-CM | POA: Insufficient documentation

## 2014-10-11 NOTE — Progress Notes (Signed)
Multiple prior colonoscopies in [place]. Cannot see that she has been seen here previously. Now sent for rectal bleeding. Most recent colonoscopies 2010 and 2013 were negative except for hemorrhoids.. Plans for sigmoidoscopy as noted

## 2014-10-12 ENCOUNTER — Encounter: Payer: Self-pay | Admitting: Internal Medicine

## 2014-10-12 ENCOUNTER — Ambulatory Visit (AMBULATORY_SURGERY_CENTER): Payer: BC Managed Care – PPO | Admitting: Internal Medicine

## 2014-10-12 VITALS — BP 104/66 | HR 79 | Temp 98.0°F | Resp 18 | Ht 68.25 in | Wt 188.0 lb

## 2014-10-12 DIAGNOSIS — K625 Hemorrhage of anus and rectum: Secondary | ICD-10-CM

## 2014-10-12 MED ORDER — SODIUM CHLORIDE 0.9 % IV SOLN: 500.0000 mL | INTRAVENOUS | Status: DC

## 2014-10-12 NOTE — Op Note (Signed)
[facility]  [address] [place], [id number]   FLEXIBLE SIGMOIDOSCOPY PROCEDURE REPORT  PATIENT: Felicia, [place]  MR#: [id number] BIRTHDATE: 09-26-1961 , 56  yrs. old GENDER: female ENDOSCOPIST: Eustace Quail, MD REFERRED BY: Velna Hatchet, M.D. PROCEDURE DATE:  10/12/2014 PROCEDURE:   Sigmoidoscopy, diagnostic ASA CLASS:   Class II INDICATIONS:rectal bleeding. Multiple prior colonoscopies in [place]. Last two 2010 and 2013 - normal (hemorrhoids) MEDICATIONS: Monitored anesthesia care and Propofol 200 mg IV  DESCRIPTION OF PROCEDURE:   After the risks benefits and alternatives of the procedure were thoroughly explained, informed consent was obtained.  Digital exam revealed no abnormalities of the rectum. The LB [id number]  endoscope was introduced through the anus  and advanced to the proximal transverse colon , The exam was Without limitations.    The quality of the prep was The overall prep quality was excellent. .  The instrument was then slowly withdrawn as the mucosa was fully examined.         COLON FINDINGS: A normal appearing distal ascending, transverse, descending, sigmoid colon, and rectum appeared unremarkable. Retroflexed views revealed small internal hemorrhoids.    The scope was then withdrawn from the patient and the procedure terminated.  COMPLICATIONS: There were no immediate complications.  ENDOSCOPIC IMPRESSION: 1. Normal colonoscopy to hepatic flexure  RECOMMENDATIONS: 1. Colonoscopy 2016 (Family hx)  NOTE: REVIEW OF YOUR OLD RECORD SHOWED "FATTY LIVER" ON X-RAY STUDIES. AS WELL, RECENT LABS WITH MILDLY ELEVATED LIVER TEST. RECOMMEND THAT YOU SCHEDULE AND APPOINTMENT WITH YOUR PCP OR DR. PERRY FOR FURTHER EVALUATION.  DISCUSSED WITH PATENT AND HUSBAND  REPEAT EXAM:  eSigned:  Eustace Quail, MD 10/12/2014 11:26 AM   CC:The Patient

## 2014-10-12 NOTE — Progress Notes (Signed)
Pt has passed large amt of air while in the RR

## 2014-10-12 NOTE — Progress Notes (Signed)
A/ox3 pleased with MAC, report to Kristin RN 

## 2014-10-12 NOTE — Patient Instructions (Addendum)
YOU HAD AN ENDOSCOPIC PROCEDURE TODAY AT [facility]: Refer to the procedure report that was given to you for any specific questions about what was found during the examination.  If the procedure report does not answer your questions, please call your gastroenterologist to clarify.  If you requested that your care partner not be given the details of your procedure findings, then the procedure report has been included in a sealed envelope for you to review at your convenience later.  YOU SHOULD EXPECT: Some feelings of bloating in the abdomen. Passage of more gas than usual.  Walking can help get rid of the air that was put into your GI tract during the procedure and reduce the bloating. If you had a lower endoscopy (such as a colonoscopy or flexible sigmoidoscopy) you may notice spotting of blood in your stool or on the toilet paper. If you underwent a bowel prep for your procedure, then you may not have a normal bowel movement for a few days.  DIET: Your first meal following the procedure should be a light meal and then it is ok to progress to your normal diet.  A half-sandwich or bowl of soup is an example of a good first meal.  Heavy or fried foods are harder to digest and may make you feel nauseous or bloated.  Likewise meals heavy in dairy and vegetables can cause extra gas to form and this can also increase the bloating.  Drink plenty of fluids but you should avoid alcoholic beverages for 24 hours.  ACTIVITY: Your care partner should take you home directly after the procedure.  You should plan to take it easy, moving slowly for the rest of the day.  You can resume normal activity the day after the procedure however you should NOT DRIVE or use heavy machinery for 24 hours (because of the sedation medicines used during the test).    SYMPTOMS TO REPORT IMMEDIATELY: A gastroenterologist can be reached at any hour.  During normal business hours, 8:30 AM to 5:00 PM Monday through Friday,  call [phone].  After hours and on weekends, please call the GI answering service at [phone] who will take a message and have the physician on call contact you.   Following lower endoscopy (colonoscopy or flexible sigmoidoscopy):  Excessive amounts of blood in the stool  Significant tenderness or worsening of abdominal pains  Swelling of the abdomen that is new, acute  Fever of 100F or higher  FOLLOW UP:.  Our staff will call the home number listed on your records the next business day following your procedure to check on you and address any questions or concerns that you may have at that time regarding the information given to you following your procedure. This is a courtesy call and so if there is no answer at the home number and we have not heard from you through the emergency physician on call, we will assume that you have returned to your regular daily activities without incident.  SIGNATURES/CONFIDENTIALITY: You and/or your care partner have signed paperwork which will be entered into your electronic medical record.  These signatures attest to the fact that that the information above on your After Visit Summary has been reviewed and is understood.  Full responsibility of the confidentiality of this discharge information lies with you and/or your care-partner.  Continue your normal medications  Next colonoscopy- 2018  Please follow up with Dr. Henrene Pastor or family doctor per recommendations

## 2014-10-13 ENCOUNTER — Telehealth: Payer: Self-pay | Admitting: *Deleted

## 2014-10-13 NOTE — Telephone Encounter (Signed)
  Follow up Call-  Call back number 10/12/2014  Post procedure Call Back phone  # [phone]  Permission to leave phone message Yes     Patient questions:  Do you have a fever, pain , or abdominal swelling? No. Pain Score  0 *  Have you tolerated food without any problems? Yes.    Have you been able to return to your normal activities? Yes.    Do you have any questions about your discharge instructions: Diet   No. Medications  No. Follow up visit  No.  Do you have questions or concerns about your Care? No.  Actions: * If pain score is 4 or above: No action needed, pain <4.  Spoke with spouse, Elta Gu[place], states pt. Asleep.  She has done well post colonoscopy.  Encouraged him to have her call if questions or concerns.

## 2014-10-25 ENCOUNTER — Ambulatory Visit (INDEPENDENT_AMBULATORY_CARE_PROVIDER_SITE_OTHER): Payer: BC Managed Care – PPO | Admitting: Certified Nurse Midwife

## 2014-10-25 ENCOUNTER — Encounter: Payer: Self-pay | Admitting: Certified Nurse Midwife

## 2014-10-25 VITALS — BP 128/90 | HR 80 | Resp 16 | Ht 68.25 in | Wt 185.0 lb

## 2014-10-25 DIAGNOSIS — N952 Postmenopausal atrophic vaginitis: Secondary | ICD-10-CM

## 2014-10-25 NOTE — Progress Notes (Signed)
53 y.o. Married Caucasian female G2P2002 here for follow up of vaginal dryness treated with coconut oil initiated on 09/21/14. Using coconut oil 3 x weekly and feels this has really helped with dryness. Completed Terazol 7 cream and all symptoms have resolved. Recent sigmoid scope with tiny hemorrhoids after blood in stool noted here. Now follow up with PCP with regarding to Thyroid. She is considering Endocrine referral. No other issues today.   O: Healthy WD,WN female Affect: normal, non tender Abdomen:soft non tender Pelvic exam:EXTERNAL GENITALIA: normal appearing vulva with no masses, tenderness or lesions VAGINA: no abnormal discharge or lesions, or redness. Moisture noted CERVIX: absent UTERUS: absent ADNEXA: no masses, adnexal not palpable  A:Atrophic vaginitis responding to Coconut oil Hypothyroid under follow up.   P: Discussed findings of normal exam and response to coconut oil. Questions addressed.  Rv prn, aex

## 2014-11-01 NOTE — Progress Notes (Signed)
Reviewed personally.  M. Suzanne Shelby Anderle, MD.  

## 2014-11-21 ENCOUNTER — Encounter: Payer: Self-pay | Admitting: Internal Medicine

## 2014-11-28 ENCOUNTER — Ambulatory Visit: Payer: BC Managed Care – PPO | Admitting: Certified Nurse Midwife

## 2014-11-28 ENCOUNTER — Telehealth: Payer: Self-pay | Admitting: Certified Nurse Midwife

## 2014-11-28 NOTE — Telephone Encounter (Signed)
Left message to reschedule aex.

## 2015-01-23 ENCOUNTER — Ambulatory Visit (INDEPENDENT_AMBULATORY_CARE_PROVIDER_SITE_OTHER): Payer: BLUE CROSS/BLUE SHIELD | Admitting: Certified Nurse Midwife

## 2015-01-23 ENCOUNTER — Encounter: Payer: Self-pay | Admitting: Certified Nurse Midwife

## 2015-01-23 VITALS — BP 112/76 | HR 72 | Resp 16 | Ht 68.25 in | Wt 183.0 lb

## 2015-01-23 DIAGNOSIS — Z01419 Encounter for gynecological examination (general) (routine) without abnormal findings: Secondary | ICD-10-CM | POA: Diagnosis not present

## 2015-01-23 NOTE — Patient Instructions (Signed)

## 2015-01-23 NOTE — Progress Notes (Signed)
54 y.o. G76P2002 Married  Caucasian Fe here for annual exam. Menopausal no HRT. Denies vaginal dryness, since she has been using coconut oil for use. Seeing Dr. Henrene Pastor for enlarged liver due to elevated liver enzymes and will follow up with colonoscopy there. Has not noted any vaginal bleeding. Recent work up with Urology and double ureter ? Side noted. PCP manages her Hypothyroid and aex/labs pertinent to her exam. Denies any Gyn concerns today.  Patient's last menstrual period was 11/24/2002.          Sexually active: Yes.    The current method of family planning is status post hysterectomy.    Exercising: No.  exercise Smoker:  yes  Health Maintenance: Pap: 11-09-13 neg MMG: 06-07-14 category b density,birads 1:neg Colonoscopy:  4/13 f/u 65yrs BMD:   2009 normal TDaP:  10/13 Labs: none Self breast exam: not done   reports that she has been smoking Cigarettes.  She has been smoking about 1.00 pack per day. She has never used smokeless tobacco. She reports that she does not drink alcohol or use illicit drugs.  Past Medical History  Diagnosis Date  . Bladder cancer   . Anal fissure   . Colon polyps     TUBULAR ADENOMA   . Depression   . Anxiety   . Diverticulosis   . Diverticulitis   . GERD (gastroesophageal reflux disease)   . Thyroid disease   . Pneumonia   . UTI (lower urinary tract infection)   . Asthma   . Fatty liver   . Endometriosis   . Fibroid   . Genital warts   . Hiatal hernia     Past Surgical History  Procedure Laterality Date  . Cesarean section    . Spine surgery    . Tonsillectomy    . Vaginal hysterectomy  2004  . Bronchoscopy      Current Outpatient Prescriptions  Medication Sig Dispense Refill  . Betamethasone Valerate 0.12 % foam as needed.    . calcipotriene (DOVONOX) 0.005 % cream     . cetirizine (ZYRTEC) 10 MG tablet Take 10 mg by mouth daily.    . Cholecalciferol (VITAMIN D-3 PO) Take 1 tablet by mouth daily.    . Clobetasol Propionate  Emulsion 0.05 % topical foam     . clonazePAM (KLONOPIN) 0.5 MG tablet Take 0.5 mg by mouth 2 (two) times daily as needed for anxiety.    . Cyanocobalamin (VITAMIN B-12 CR PO) Take 1 tablet by mouth daily.    Marland Kitchen FLUOCINOLONE ACETONIDE SCALP 0.01 % OIL as needed.  0  . JUBLIA 10 % SOLN   3  . levothyroxine (SYNTHROID, LEVOTHROID) 88 MCG tablet Take 88 mcg by mouth daily before breakfast.    . liothyronine (CYTOMEL) 5 MCG tablet Take 10 mcg by mouth daily.  0  . calcipotriene-betamethasone (TACLONEX) ointment as needed.     No current facility-administered medications for this visit.    Family History  Problem Relation Age of Onset  . Breast cancer Maternal Grandmother   . Stroke Maternal Grandmother   . Colon cancer Mother   . Cancer Mother     liver  . Hypertension Father   . Thyroid disease Father   . Heart attack Maternal Grandfather     ROS:  Pertinent items are noted in HPI.  Otherwise, a comprehensive ROS was negative.  Exam:   BP 112/76 mmHg  Pulse 72  Resp 16  Ht 5' 8.25" (1.734 m)  Wt 183  lb (83.008 kg)  BMI 27.61 kg/m2  LMP 11/24/2002 Height: 5' 8.25" (173.4 cm) Ht Readings from Last 3 Encounters:  01/23/15 5' 8.25" (1.734 m)  10/25/14 5' 8.25" (1.734 m)  10/12/14 5' 8.25" (1.734 m)    General appearance: alert, cooperative and appears stated age Head: Normocephalic, without obvious abnormality, atraumatic Neck: no adenopathy, supple, symmetrical, trachea midline and thyroid normal to inspection and palpation Lungs: clear to auscultation bilaterally Breasts: normal appearance, no masses or tenderness, No nipple retraction or dimpling, No nipple discharge or bleeding, No axillary or supraclavicular adenopathy Heart: regular rate and rhythm Abdomen: soft, non-tender; no masses,  no organomegaly Extremities: extremities normal, atraumatic, no cyanosis or edema, has skin rash on legs from groin to mid knee using steroid foam ? helping Skin: Skin color, texture,  turgor normal. No rashes or lesions Lymph nodes: Cervical, supraclavicular, and axillary nodes normal. No abnormal inguinal nodes palpated Neurologic: Grossly normal   Pelvic: External genitalia:  no lesions              Urethra:  normal appearing urethra with no masses, tenderness or lesions              Bartholin's and Skene's: normal                 Vagina: normal appearing vagina with normal color and discharge, no lesions              Cervix: absent              Pap taken: No. 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  Continue MD management of her other medical problems and follow up  Discussed if she notes any genital warts needs OV to treat if desired.  Pap smear not taken today   counseled on breast self exam, mammography screening, adequate intake of calcium and vitamin D, diet and exercise  return annually or prn  An After Visit Summary was printed and given to the patient.

## 2015-01-30 ENCOUNTER — Ambulatory Visit (INDEPENDENT_AMBULATORY_CARE_PROVIDER_SITE_OTHER): Payer: BLUE CROSS/BLUE SHIELD | Admitting: Internal Medicine

## 2015-01-30 ENCOUNTER — Other Ambulatory Visit (INDEPENDENT_AMBULATORY_CARE_PROVIDER_SITE_OTHER): Payer: BLUE CROSS/BLUE SHIELD

## 2015-01-30 ENCOUNTER — Encounter: Payer: Self-pay | Admitting: Internal Medicine

## 2015-01-30 VITALS — BP 130/80 | HR 89 | Ht 68.0 in | Wt 188.2 lb

## 2015-01-30 DIAGNOSIS — R945 Abnormal results of liver function studies: Principal | ICD-10-CM

## 2015-01-30 DIAGNOSIS — R932 Abnormal findings on diagnostic imaging of liver and biliary tract: Secondary | ICD-10-CM

## 2015-01-30 DIAGNOSIS — R7989 Other specified abnormal findings of blood chemistry: Secondary | ICD-10-CM

## 2015-01-30 DIAGNOSIS — K760 Fatty (change of) liver, not elsewhere classified: Secondary | ICD-10-CM

## 2015-01-30 DIAGNOSIS — K625 Hemorrhage of anus and rectum: Secondary | ICD-10-CM

## 2015-01-30 LAB — HEPATIC FUNCTION PANEL
ALBUMIN: 4.5 g/dL (ref 3.5–5.2)
ALT: 34 U/L (ref 0–35)
AST: 22 U/L (ref 0–37)
Alkaline Phosphatase: 88 U/L (ref 39–117)
Bilirubin, Direct: 0 mg/dL (ref 0.0–0.3)
TOTAL PROTEIN: 7.6 g/dL (ref 6.0–8.3)
Total Bilirubin: 0.3 mg/dL (ref 0.2–1.2)

## 2015-01-30 LAB — IBC PANEL
Iron: 84 ug/dL (ref 42–145)
Saturation Ratios: 20.9 % (ref 20.0–50.0)
Transferrin: 287 mg/dL (ref 212.0–360.0)

## 2015-01-30 LAB — FERRITIN: FERRITIN: 38 ng/mL (ref 10.0–291.0)

## 2015-01-30 LAB — PROTIME-INR
INR: 0.9 ratio (ref 0.8–1.0)
Prothrombin Time: 9.8 s (ref 9.6–13.1)

## 2015-01-30 LAB — IRON: IRON: 84 ug/dL (ref 42–145)

## 2015-01-30 NOTE — Progress Notes (Signed)
Reviewed personally.  M. Suzanne Suriya Kovarik, MD.  

## 2015-01-30 NOTE — Patient Instructions (Signed)
Your physician has requested that you go to the basement for lab work before leaving today  Please follow up with Dr. Henrene Pastor on 03/06/2015 at 8:30am

## 2015-01-30 NOTE — Progress Notes (Signed)
HISTORY OF PRESENT ILLNESS:  Felicia [place] is a 54 y.o. female who presents today for evaluation of abnormal hepatic imaging and abnormal liver function tests. The patient has a family history of colon cancer in her mother (57s) for which she has undergone multiple prior colonoscopies in [place]. Last complete colonoscopy (reviewed the 18th 2013) was normal. She established care here last year and was seen by Dr. Verl Blalock in May 2014. She was felt to have had resolved an episode of ischemic colitis. Also noted to have fatty liver on imaging. She was seen again by the nurse practitioner 10/05/2014 regarding rectal bleeding. See that dictation for details. Also having increased frequency of bowel movements. Thus, she underwent flexible sigmoidoscopy to the level of the hepatic flexure. No abnormalities noted. Since that time she reports no further bleeding. Bowel habits have improved. Other complaints include bloating, heartburn, weight gain, and gas. Multiple non-GI complaints as listed below. She denies a family history of liver disease. She denies a personal history of liver disease or hepatitis. She smokes, but does not use alcohol. Review of blood work from November 2015 reveals an ALT of 49. Otherwise normal comprehensive metabolic panel. Normal CBC with platelets. Decreased TSH. Thyroid being managed by her PCP.  REVIEW OF SYSTEMS:  All non-GI ROS negative except for urinary complaints, back pain, sinus and allergy, anxiety, arthritis, cough, fatigue, skin rash, sore throat, ankle swelling, urinary frequency, urinary leakage, swollen lymph glands, shortness of breath  Past Medical History  Diagnosis Date  . Bladder cancer   . Anal fissure   . Colon polyps     TUBULAR ADENOMA   . Depression   . Anxiety   . Diverticulosis   . Diverticulitis   . GERD (gastroesophageal reflux disease)   . Thyroid disease   . Pneumonia   . UTI (lower urinary tract infection)   . Asthma   . Fatty liver    . Endometriosis   . Fibroid   . Genital warts   . Hiatal hernia   . Enlarged liver   . Hepatomegaly   . Hemorrhoids     Past Surgical History  Procedure Laterality Date  . Cesarean section    . Spine surgery    . Tonsillectomy    . Vaginal hysterectomy  2004  . Bronchoscopy      Social History [name]  reports that she has been smoking Cigarettes.  She has been smoking about 1.00 pack per day. She has never used smokeless tobacco. She reports that she does not drink alcohol or use illicit drugs.  family history includes Breast cancer in her maternal grandmother; Cancer in her mother; Colon cancer in her mother; Heart attack in her maternal grandfather; Hypertension in her father; Stroke in her maternal grandmother; Thyroid disease in her father.  Allergies  Allergen Reactions  . Chantix [Varenicline] Hives    Rash on breast  . Penicillins Hives  . Sulfa Antibiotics Hives       PHYSICAL EXAMINATION: Vital signs: BP 130/80 mmHg  Pulse 89  Ht 5\' 8"  (1.727 m)  Wt 188 lb 3.2 oz (85.367 kg)  BMI 28.62 kg/m2  SpO2 99%  LMP 11/24/2002  Constitutional: generally well-appearing, no acute distress Psychiatric: alert and oriented x3, cooperative Eyes: extraocular movements intact, anicteric, conjunctiva pink Mouth: oral pharynx moist, no lesions Neck: supple no lymphadenopathy Cardiovascular: heart regular rate and rhythm, no murmur Lungs: clear to auscultation bilaterally Abdomen: soft, obese, nontender, nondistended, no obvious ascites, no peritoneal signs,  normal bowel sounds, palpable right liver edge. No splenomegaly  Rectal: Unremarkable on recent sigmoidoscopy. Not repeated Extremities: no lower extremity edema bilaterally Skin: no lesions on visible extremities Neuro: No focal deficits. Normal deep tendon reflexes. No asterixis.    ASSESSMENT:  #1. Imaging consistent with fatty liver. Hepatomegaly noted. #2. Mild elevation ALT. No evidence for hepatic  synthetic dysfunction. #3. Rectal bleeding. Hemorrhoids. #4. Family history of colon cancer. Last complete colonoscopy 2013 #5. Multiple non-GI complaints   PLAN:  #1. Obtain multiple viral nonviral studies to evaluate elevated ALT #2. Discussed fatty liver. Recommended exercise and weight loss #3. GI follow-up regarding liver testing in 2 months #4. Screening colonoscopy 2018. #5. Return to PCP for multiple non-GI complaints

## 2015-01-31 LAB — MITOCHONDRIAL ANTIBODIES: Mitochondrial M2 Ab, IgG: 0.07 (ref <0.91)

## 2015-01-31 LAB — HEPATITIS B SURFACE ANTIGEN: HEP B S AG: NEGATIVE

## 2015-01-31 LAB — HEPATITIS B SURFACE ANTIBODY,QUALITATIVE: HEP B S AB: NEGATIVE

## 2015-01-31 LAB — HEPATITIS C ANTIBODY: HCV AB: NEGATIVE

## 2015-01-31 LAB — ANA: ANA: NEGATIVE

## 2015-02-01 LAB — CERULOPLASMIN: Ceruloplasmin: 32 mg/dL (ref 18–53)

## 2015-02-01 LAB — ALPHA-1-ANTITRYPSIN: A1 ANTITRYPSIN SER: 154 mg/dL (ref 83–199)

## 2015-02-02 LAB — ANTI-SMOOTH MUSCLE ANTIBODY, IGG: Smooth Muscle Ab: 7 U (ref <20)

## 2015-02-06 ENCOUNTER — Other Ambulatory Visit: Payer: Self-pay | Admitting: Internal Medicine

## 2015-02-06 DIAGNOSIS — R911 Solitary pulmonary nodule: Secondary | ICD-10-CM

## 2015-02-09 ENCOUNTER — Inpatient Hospital Stay: Admission: RE | Admit: 2015-02-09 | Payer: BLUE CROSS/BLUE SHIELD | Source: Ambulatory Visit

## 2015-02-15 ENCOUNTER — Ambulatory Visit
Admission: RE | Admit: 2015-02-15 | Discharge: 2015-02-15 | Disposition: A | Discharge status: Home / Self Care | Payer: BLUE CROSS/BLUE SHIELD | Source: Ambulatory Visit | Attending: Internal Medicine | Admitting: Internal Medicine

## 2015-02-15 DIAGNOSIS — R911 Solitary pulmonary nodule: Secondary | ICD-10-CM

## 2015-03-02 ENCOUNTER — Telehealth: Payer: Self-pay | Admitting: Internal Medicine

## 2015-03-02 NOTE — Telephone Encounter (Signed)
Left patient a message to call back & let her know. Will also send her a letter.

## 2015-03-02 NOTE — Telephone Encounter (Signed)
OV note from March with Dr. Henrene Pastor states follow-up in 2 months which would be May

## 2015-03-06 ENCOUNTER — Ambulatory Visit: Payer: BLUE CROSS/BLUE SHIELD | Admitting: Internal Medicine

## 2015-04-25 ENCOUNTER — Encounter: Payer: Self-pay | Admitting: Internal Medicine

## 2015-04-25 ENCOUNTER — Ambulatory Visit (INDEPENDENT_AMBULATORY_CARE_PROVIDER_SITE_OTHER): Payer: BLUE CROSS/BLUE SHIELD | Admitting: Internal Medicine

## 2015-04-25 VITALS — BP 114/84 | HR 80 | Temp 98.9°F | Ht 68.0 in | Wt 189.0 lb

## 2015-04-25 DIAGNOSIS — R945 Abnormal results of liver function studies: Secondary | ICD-10-CM

## 2015-04-25 DIAGNOSIS — R7989 Other specified abnormal findings of blood chemistry: Secondary | ICD-10-CM | POA: Diagnosis not present

## 2015-04-25 DIAGNOSIS — K760 Fatty (change of) liver, not elsewhere classified: Secondary | ICD-10-CM | POA: Diagnosis not present

## 2015-04-25 DIAGNOSIS — K625 Hemorrhage of anus and rectum: Secondary | ICD-10-CM | POA: Diagnosis not present

## 2015-04-25 NOTE — Patient Instructions (Signed)
Please follow up with Dr. Perry as needed 

## 2015-04-25 NOTE — Progress Notes (Signed)
HISTORY OF PRESENT ILLNESS:  Felicia Leonard is a 54 y.o. female who I saw 01/30/2015 regarding abnormal hepatic imaging and mild elevation of ALT. See that dictation for details. Also complaints of intermittent rectal bleeding and multiple month GI complaints. Multiple viral and nonviral studies were obtained and returned normal. Repeat liver tests were also normal. She was felt to have fatty liver disease. Exercise and weight loss recommended. Follow-up at this time recommended. Patient states that she walks. No weight loss. Still smokes. Still having minor intermittent rectal bleeding. Flexible sigmoidoscopy November 2015 was normal to the hepatic flexure with internal hemorrhoids noted. No new GI complaints. Multiple non-GI complaints  REVIEW OF SYSTEMS:  All non-GI ROS negative except for sinus and allergy, anxiety, back pain, cough, fatigue, headaches, hearing problems, skin rash, sore throat, ankle edema, increased urination, urinary leakage  Past Medical History  Diagnosis Date  . Bladder cancer   . Anal fissure   . Colon polyps     TUBULAR ADENOMA   . Depression   . Anxiety   . Diverticulosis   . Diverticulitis   . GERD (gastroesophageal reflux disease)   . Thyroid disease   . Pneumonia   . UTI (lower urinary tract infection)   . Asthma   . Fatty liver   . Endometriosis   . Fibroid   . Genital warts   . Hiatal hernia   . Enlarged liver   . Hepatomegaly   . Hemorrhoids     Past Surgical History  Procedure Laterality Date  . Cesarean section    . Spine surgery    . Tonsillectomy    . Vaginal hysterectomy  2004  . Bronchoscopy      Social History Felicia Leonard  reports that she has been smoking Cigarettes.  She has been smoking about 1.00 pack per day. She has never used smokeless tobacco. She reports that she does not drink alcohol or use illicit drugs.  family history includes Breast cancer in her maternal grandmother; Cancer in her mother; Colon cancer in her  mother; Heart attack in her maternal grandfather; Hypertension in her father; Stroke in her maternal grandmother; Thyroid disease in her father.  Allergies  Allergen Reactions  . Chantix [Varenicline] Hives    Rash on breast  . Penicillins Hives  . Sulfa Antibiotics Hives       PHYSICAL EXAMINATION: Vital signs: BP 114/84 mmHg  Pulse 80  Temp(Src) 98.9 F (37.2 C)  Ht 5\' 8"  (1.727 m)  Wt 189 lb (85.73 kg)  BMI 28.74 kg/m2  LMP 11/24/2002 General: Well-developed, well-nourished, no acute distress HEENT: Sclerae are anicteric, conjunctiva pink. Oral mucosa intact Lungs: Clear Heart: Regular Abdomen: soft, nontender, nondistended, no obvious ascites, no peritoneal signs, normal bowel sounds. No organomegaly. Extremities: No edema Psychiatric: alert and oriented x3. Cooperative   ASSESSMENT:  #1. Fatty liver without evidence of clinically significant liver disease #2. Minor rectal bleeding secondary to small internal hemorrhoids  #3. Family history of colon cancer. Last complete colonoscopy 2013  PLAN:  #1. Exercise and weight loss #2. Stop smoking #3. Screening colonoscopy 2018. Patient aware #4. Return to the care of your PCP. GI follow-up as needed  A copy has been sent to Dr.Holwerda

## 2015-07-10 ENCOUNTER — Other Ambulatory Visit: Payer: Self-pay

## 2015-07-10 DIAGNOSIS — Z1231 Encounter for screening mammogram for malignant neoplasm of breast: Secondary | ICD-10-CM

## 2015-07-12 ENCOUNTER — Ambulatory Visit
Admission: RE | Admit: 2015-07-12 | Discharge: 2015-07-12 | Disposition: A | Discharge status: Home / Self Care | Payer: BLUE CROSS/BLUE SHIELD | Source: Ambulatory Visit

## 2015-07-12 DIAGNOSIS — Z1231 Encounter for screening mammogram for malignant neoplasm of breast: Secondary | ICD-10-CM

## 2015-09-19 ENCOUNTER — Ambulatory Visit (INDEPENDENT_AMBULATORY_CARE_PROVIDER_SITE_OTHER): Payer: BLUE CROSS/BLUE SHIELD | Admitting: Certified Nurse Midwife

## 2015-09-19 ENCOUNTER — Encounter: Payer: Self-pay | Admitting: Certified Nurse Midwife

## 2015-09-19 VITALS — BP 120/80 | HR 72 | Resp 20 | Ht 68.25 in | Wt 191.0 lb

## 2015-09-19 DIAGNOSIS — B373 Candidiasis of vulva and vagina: Secondary | ICD-10-CM

## 2015-09-19 DIAGNOSIS — R350 Frequency of micturition: Secondary | ICD-10-CM | POA: Diagnosis not present

## 2015-09-19 DIAGNOSIS — B3731 Acute candidiasis of vulva and vagina: Secondary | ICD-10-CM

## 2015-09-19 LAB — POCT URINALYSIS DIPSTICK
Bilirubin, UA: NEGATIVE
Blood, UA: NEGATIVE
GLUCOSE UA: NEGATIVE
KETONES UA: NEGATIVE
LEUKOCYTES UA: NEGATIVE
Nitrite, UA: NEGATIVE
PROTEIN UA: NEGATIVE
Urobilinogen, UA: NEGATIVE
pH, UA: 5

## 2015-09-19 MED ORDER — NYSTATIN-TRIAMCINOLONE 100000-0.1 UNIT/GM-% EX CREA: 1.0000 "application " | TOPICAL_CREAM | Freq: Two times a day (BID) | CUTANEOUS | Status: DC

## 2015-09-19 NOTE — Progress Notes (Signed)
Encounter reviewed Jill Jertson, MD   

## 2015-09-19 NOTE — Patient Instructions (Signed)

## 2015-09-19 NOTE — Progress Notes (Signed)
54 y.o.Married white g2p2002 here with complaint of vaginal symptoms of itching, burning, and increase discharge. Describes discharge as  yellow and slight odor. Patient has psoriasis and feels this is not a flare, but having some areas starting on knees again. Wore fuzzy bed clothes and noted some irritation after removing them. Also has sexual activity infrequent with spouse due to his ED and noted this made the external itching worse. Onset of symptoms 3 days ago. Denies new personal products or vaginal dryness. No STD concerns. Urinary symptoms occasional frequency but no other symptoms.. Contraception is menopausal no HRT. No other health concerns today.   O:Healthy female WDWN Affect: normal, orientation x 3  Exam:Skin: warm and dry Abdomen:soft, non tender, negative suprapubic  Inguinal Lymph node: no enlargement or tenderness Pelvic exam: External genital: normal female with redness, slight edema and scaling with slight exudate noted. Wet prep taken from area, no lesions BUS: negative Bladder and urethral meatus non tender Vagina: scant discharge noted.  Affirm taken Cervix: absent Uterus: absent Adnexa: no masses or fullness noted Perineal area: no lesions noted  Wet Prep results: positive for yeast poct urine-neg  A:Normal pelvic exam Yeast vulvitis History of Psoriasis with flare on knees patient has medication for use   P:Discussed findings of yeast vulvitis and etiology. Discussed Aveeno sitz bath for comfort. Avoid moist clothes or pads for extended period of time. If working out in gym clothes for long periods of time change underwear if possible. Olive Oil/Coconut Oil use for skin protection prior to activity can be used to external skin. Will treat per affirm if indicated. Questions addressed. Rx: Mycolog cream see order with instructions  Rv prn

## 2015-09-20 LAB — WET PREP BY MOLECULAR PROBE
Candida species: NEGATIVE
GARDNERELLA VAGINALIS: NEGATIVE
Trichomonas vaginosis: NEGATIVE

## 2016-01-24 ENCOUNTER — Ambulatory Visit (INDEPENDENT_AMBULATORY_CARE_PROVIDER_SITE_OTHER): Payer: BLUE CROSS/BLUE SHIELD | Admitting: Certified Nurse Midwife

## 2016-01-24 ENCOUNTER — Encounter: Payer: Self-pay | Admitting: Certified Nurse Midwife

## 2016-01-24 VITALS — BP 122/80 | HR 70 | Resp 16 | Ht 68.0 in | Wt 190.0 lb

## 2016-01-24 DIAGNOSIS — Z0000 Encounter for general adult medical examination without abnormal findings: Secondary | ICD-10-CM | POA: Diagnosis not present

## 2016-01-24 DIAGNOSIS — Z01419 Encounter for gynecological examination (general) (routine) without abnormal findings: Secondary | ICD-10-CM | POA: Diagnosis not present

## 2016-01-24 DIAGNOSIS — N898 Other specified noninflammatory disorders of vagina: Secondary | ICD-10-CM

## 2016-01-24 DIAGNOSIS — N952 Postmenopausal atrophic vaginitis: Secondary | ICD-10-CM | POA: Diagnosis not present

## 2016-01-24 DIAGNOSIS — L298 Other pruritus: Secondary | ICD-10-CM | POA: Diagnosis not present

## 2016-01-24 DIAGNOSIS — Z124 Encounter for screening for malignant neoplasm of cervix: Secondary | ICD-10-CM

## 2016-01-24 LAB — POCT URINALYSIS DIPSTICK
Bilirubin, UA: NEGATIVE
Glucose, UA: NEGATIVE
Ketones, UA: NEGATIVE
LEUKOCYTES UA: NEGATIVE
NITRITE UA: NEGATIVE
PH UA: 5
PROTEIN UA: NEGATIVE
RBC UA: NEGATIVE
UROBILINOGEN UA: NEGATIVE

## 2016-01-24 NOTE — Progress Notes (Signed)
55 y.o. G90P2002 Married  Caucasian Fe here for annual exam. Menopausal no HRT. Complaining of vaginal itching and noted slight pink with scratching. Was treated for yeast vulvitis in 10/16, no issue until now. Having psoriasis flare now again too. Denies vaginal discharge, but has vaginal dryness and had stopped coconut oil for moisture. Denies urinary symptoms or new personal products. Sees Dr Ardeth Perfect for aex/labs,hypothyroid management. Sees Dr. Henrene Pastor for fatty liver follow up and labs regarding. No other health issues today. Planning time for self soon.  Patient's last menstrual period was 11/24/2002.          Sexually active: No.  The current method of family planning is status post hysterectomy.    Exercising: No.  exercise Smoker:  yes  Health Maintenance: Pap:  11-09-13 neg MMG:  07-12-15 neg category c density birads 1:neg Colonoscopy:  4/13 f/u 49yrs BMD:   2009 normal TDaP:  10/13 Shingles: no Pneumonia: 2014 Hep C and HIV: Hep C 2016 neg, HIV during pregnancy neg Labs: poct urine-neg Self breast exam: not done   reports that she has been smoking Cigarettes.  She has been smoking about 1.00 pack per day. She has never used smokeless tobacco. She reports that she does not drink alcohol or use illicit drugs.  Past Medical History  Diagnosis Date  . Bladder cancer ([place])   . Anal fissure   . Colon polyps     TUBULAR ADENOMA   . Depression   . Anxiety   . Diverticulosis   . Diverticulitis   . GERD (gastroesophageal reflux disease)   . Thyroid disease   . Pneumonia   . UTI (lower urinary tract infection)   . Asthma   . Fatty liver   . Endometriosis   . Fibroid   . Genital warts   . Hiatal hernia   . Enlarged liver   . Hepatomegaly   . Hemorrhoids     Past Surgical History  Procedure Laterality Date  . Cesarean section    . Spine surgery    . Tonsillectomy    . Vaginal hysterectomy  2004  . Bronchoscopy      Current Outpatient Prescriptions  Medication Sig  Dispense Refill  . Betamethasone Valerate 0.12 % foam as needed.    . calcipotriene (DOVONOX) 0.005 % cream     . calcipotriene-betamethasone (TACLONEX) ointment as needed.    . cetirizine (ZYRTEC) 10 MG tablet Take 10 mg by mouth daily.    . Cholecalciferol (VITAMIN D-3 PO) Take 1 tablet by mouth daily.    . Clobetasol Propionate Emulsion 0.05 % topical foam     . clonazePAM (KLONOPIN) 1 MG tablet   0  . Cyanocobalamin (VITAMIN B-12 CR PO) Take 1 tablet by mouth daily.    Marland Kitchen FLUOCINOLONE ACETONIDE SCALP 0.01 % OIL as needed.  0  . levothyroxine (SYNTHROID, LEVOTHROID) 88 MCG tablet Take 88 mcg by mouth daily before breakfast.    . liothyronine (CYTOMEL) 5 MCG tablet Take 10 mcg by mouth daily.  0  . nystatin-triamcinolone (MYCOLOG II) cream Apply 1 application topically 2 (two) times daily. Apply to affected area BID for up to 7 days. 60 g 0  . omeprazole (PRILOSEC) 40 MG capsule   0  . PROAIR HFA 108 (90 Base) MCG/ACT inhaler USE 1 PUFF EVERY 6 HOURS AS NEEDED FOR DYSPNEA AND WHEEZING  0   No current facility-administered medications for this visit.    Family History  Problem Relation Age of Onset  .  Breast cancer Maternal Grandmother   . Stroke Maternal Grandmother   . Colon cancer Mother   . Cancer Mother     liver  . Hypertension Father   . Thyroid disease Father   . Heart attack Maternal Grandfather     ROS:  Pertinent items are noted in HPI.  Otherwise, a comprehensive ROS was negative.  Exam:   BP 122/80 mmHg  Pulse 70  Resp 16  Ht 5\' 8"  (1.727 m)  Wt 190 lb (86.183 kg)  BMI 28.90 kg/m2  LMP 11/24/2002 Height: 5\' 8"  (172.7 cm) Ht Readings from Last 3 Encounters:  01/24/16 5\' 8"  (1.727 m)  09/19/15 5' 8.25" (1.734 m)  04/25/15 5\' 8"  (1.727 m)    General appearance: alert, cooperative and appears stated age Head: Normocephalic, without obvious abnormality, atraumatic Neck: no adenopathy, supple, symmetrical, trachea midline and thyroid normal to inspection and  palpation Lungs: clear to auscultation bilaterally Breasts: normal appearance, no masses or tenderness, No nipple retraction or dimpling, No nipple discharge or bleeding, No axillary or supraclavicular adenopathy Heart: regular rate and rhythm Abdomen: soft, non-tender; no masses,  no organomegaly Extremities: extremities normal, atraumatic, no cyanosis or edema Skin: Skin color, texture, turgor normal. No rashes or lesions Lymph nodes: Cervical, supraclavicular, and axillary nodes normal. No abnormal inguinal nodes palpated Neurologic: Grossly normal   Pelvic: External genitalia:  no lesions              Urethra:  normal appearing urethra with no masses, tenderness or lesions              Bartholin's and Skene's: normal                 Vagina: atrophic appearing vagina with redness and scant moisture, small area of tissue in left fornix cuff area of vagina slightly tender and scant blood noted with Q tip touch. Pap smear taken from area and vagina. Affirm taken              Cervix: absent              Pap taken: No. Bimanual Exam:  Uterus:  uterus absent              Adnexa: no mass, fullness, tenderness               Rectovaginal: Confirms               Anus:  normal sphincter tone, no lesions  Chaperone present: yes  A:  Well Woman with normal exam  Menopausal no HRT S/P TVH for fibroids with ovaries retained  Atrophic Vaginitis with scant vaginal bleeding with Pap taken, previous coconut oil use but had stopped, had good response  R/O vaginal infection  History of genital warts none seen today  Hypothyroid/Fatty liver/ChronicPsoriasis/Aortic aneurysm under MD management      P:   Reviewed health and wellness pertinent to exam  Discussed atrophic vaginitis finding and etiology. Discussed vaginal tissue appearance and scant blood noted, probably is what she had noted. Instructed to start back with coconut oil nightly. Would like to recheck area in 2-3 weeks if no bleeding.  Patient agreeable to start again, no problems with use.  Will treat if indicated from results.  Continue follow up with MD as indicated.  Pap smear as above with HPV reflex   counseled on breast self exam, mammography screening, adequate intake of calcium and vitamin D, diet and exercise, Kegel's exercises return annually or  prn  An After Visit Summary was printed and given to the patient.

## 2016-01-24 NOTE — Patient Instructions (Signed)
EXERCISE AND DIET:  We recommended that you start or continue a regular exercise program for good health. Regular exercise means any activity that makes your heart beat faster and makes you sweat.  We recommend exercising at least 30 minutes per day at least 3 days a week, preferably 4 or 5.  We also recommend a diet low in fat and sugar.  Inactivity, poor dietary choices and obesity can cause diabetes, heart attack, stroke, and kidney damage, among others.    ALCOHOL AND SMOKING:  Women should limit their alcohol intake to no more than 7 drinks/beers/glasses of wine (combined, not each!) per week. Moderation of alcohol intake to this level decreases your risk of breast cancer and liver damage. And of course, no recreational drugs are part of a healthy lifestyle.  And absolutely no smoking or even second hand smoke. Most people know smoking can cause heart and lung diseases, but did you know it also contributes to weakening of your bones? Aging of your skin?  Yellowing of your teeth and nails?  CALCIUM AND VITAMIN D:  Adequate intake of calcium and Vitamin D are recommended.  The recommendations for exact amounts of these supplements seem to change often, but generally speaking 600 mg of calcium (either carbonate or citrate) and 800 units of Vitamin D per day seems prudent. Certain women may benefit from higher intake of Vitamin D.  If you are among these women, your doctor will have told you during your visit.    PAP SMEARS:  Pap smears, to check for cervical cancer or precancers,  have traditionally been done yearly, although recent scientific advances have shown that most women can have pap smears less often.  However, every woman still should have a physical exam from her gynecologist every year. It will include a breast check, inspection of the vulva and vagina to check for abnormal growths or skin changes, a visual exam of the cervix, and then an exam to evaluate the size and shape of the uterus and  ovaries.  And after 55 years of age, a rectal exam is indicated to check for rectal cancers. We will also provide age appropriate advice regarding health maintenance, like when you should have certain vaccines, screening for sexually transmitted diseases, bone density testing, colonoscopy, mammograms, etc.   MAMMOGRAMS:  All women over 40 years old should have a yearly mammogram. Many facilities now offer a "3D" mammogram, which may cost around $50 extra out of pocket. If possible,  we recommend you accept the option to have the 3D mammogram performed.  It both reduces the number of women who will be called back for extra views which then turn out to be normal, and it is better than the routine mammogram at detecting truly abnormal areas.    COLONOSCOPY:  Colonoscopy to screen for colon cancer is recommended for all women at age 50.  We know, you hate the idea of the prep.  We agree, BUT, having colon cancer and not knowing it is worse!!  Colon cancer so often starts as a polyp that can be seen and removed at colonscopy, which can quite literally save your life!  And if your first colonoscopy is normal and you have no family history of colon cancer, most women don't have to have it again for 10 years.  Once every ten years, you can do something that may end up saving your life, right?  We will be happy to help you get it scheduled when you are ready.    Be sure to check your insurance coverage so you understand how much it will cost.  It may be covered as a preventative service at no cost, but you should check your particular policy.     Atrophic Vaginitis Atrophic vaginitis is a condition in which the tissues that line the vagina become dry and thin. This condition is most common in women who have stopped having regular menstrual periods (menopause). This usually starts when a woman is 1-22 years old. Estrogen helps to keep the vagina moist. It stimulates the vagina to produce a clear fluid that lubricates  the vagina for sexual intercourse. This fluid also protects the vagina from infection. Lack of estrogen can cause the lining of the vagina to get thinner and dryer. The vagina may also shrink in size. It may become less elastic. Atrophic vaginitis tends to get worse over time as a woman's estrogen level drops. CAUSES This condition is caused by the normal drop in estrogen that happens around the time of menopause. RISK FACTORS Certain conditions or situations may lower a woman's estrogen level, which increases her risk of atrophic vaginitis. These include:  Taking medicine that blocks estrogen.  Having ovaries removed surgically.  Being treated for cancer with X-ray treatment (radiation) or medicines (chemotherapy).  Exercising very hard and often.  Having an eating disorder (anorexia).  Giving birth or breastfeeding.  Being over the age of 53.  Smoking. SYMPTOMS Symptoms of this condition include:  Pain, soreness, or bleeding during sexual intercourse (dyspareunia).  Vaginal burning, irritation, or itching.  Pain or bleeding during a vaginal examination using a speculum (pelvic exam).  Loss of interest in sexual activity.  Having burning pain when passing urine.  Vaginal discharge that is brown or yellow. In some cases, there are no symptoms. DIAGNOSIS This condition is diagnosed with a medical history and physical exam. This will include a pelvic exam that checks whether the inside of your vagina appears pale, thin, or dry. Rarely, you may also have other tests, including:  A urine test.  A test that checks the acid balance in your vaginal fluid (acid balance test). TREATMENT Treatment for this condition may depend on the severity of your symptoms. Treatment may include:  Using an over-the-counter vaginal lubricant before you have sexual intercourse.  Using a long-acting vaginal moisturizer.  Using low-dose vaginal estrogen for moderate to severe symptoms that do  not respond to other treatments. Options include creams, tablets, and inserts (vaginal rings). Before using vaginal estrogen, tell your health care provider if you have a history of:  Breast cancer.  Endometrial cancer.  Blood clots.  Taking medicines. You may be able to take a daily pill for dyspareunia. Discuss all of the risks of this medicine with your health care provider. It is usually not recommended for women who have a family history or personal history of breast cancer. If your symptoms are very mild and you are not sexually active, you may not need treatment. HOME CARE INSTRUCTIONS  Take medicines only as directed by your health care provider. Do not use herbal or alternative medicines unless your health care provider says that you can.  Use over-the-counter creams, lubricants, or moisturizers for dryness only as directed by your health care provider.  If your atrophic vaginitis is caused by menopause, discuss all of your menopausal symptoms and treatment options with your health care provider.  Do not douche.  Do not use products that can make your vagina dry. These include:  Scented feminine sprays.  Scented tampons.  Scented soaps.  If it hurts to have sex, talk with your sexual partner. SEEK MEDICAL CARE IF:  Your discharge looks different than normal.  Your vagina has an unusual smell.  You have new symptoms.  Your symptoms do not improve with treatment.  Your symptoms get worse.   This information is not intended to replace advice given to you by your health care provider. Make sure you discuss any questions you have with your health care provider.   Document Released: 03/27/2015 Document Reviewed: 03/27/2015 Elsevier Interactive Patient Education [employer].

## 2016-01-25 ENCOUNTER — Telehealth: Payer: Self-pay | Admitting: Internal Medicine

## 2016-01-25 LAB — WET PREP BY MOLECULAR PROBE
CANDIDA SPECIES: NEGATIVE
GARDNERELLA VAGINALIS: NEGATIVE
TRICHOMONAS VAG: NEGATIVE

## 2016-01-25 LAB — IPS PAP TEST WITH REFLEX TO HPV

## 2016-01-25 NOTE — Telephone Encounter (Signed)
Pt states that several years ago she had a "colon infection." States she was treated with antibiotics and got better. Pt states she is having the same symptoms as she did then with Left sided abdominal pain that radiated to her back and around her hip to the front. States she has no fever but the pain is really bad when she stands or sits and when she is lying in her bed on her side and tries to turn on her side. Please advise.

## 2016-01-25 NOTE — Telephone Encounter (Signed)
Left message for pt to call back  °

## 2016-01-25 NOTE — Telephone Encounter (Signed)
I'm not sure what she has going on currently. Given the severity of pain, I am not comfortable empirically prescribing antibiotics. Unfortunately, being Friday afternoon, she should go to the emergency room for evaluation as we have an urgent advance practitioner add-on spot available today for an office evaluation.

## 2016-01-25 NOTE — Telephone Encounter (Signed)
Pt aware and states she is going to try her PCP to see if she can be seen today.

## 2016-01-30 NOTE — Progress Notes (Signed)
Encounter reviewed Jill Jertson, MD   

## 2016-02-12 ENCOUNTER — Encounter: Payer: Self-pay | Admitting: Certified Nurse Midwife

## 2016-02-12 ENCOUNTER — Ambulatory Visit (INDEPENDENT_AMBULATORY_CARE_PROVIDER_SITE_OTHER): Payer: BLUE CROSS/BLUE SHIELD | Admitting: Certified Nurse Midwife

## 2016-02-12 VITALS — BP 120/80 | HR 70 | Resp 16 | Ht 68.0 in | Wt 190.0 lb

## 2016-02-12 DIAGNOSIS — N952 Postmenopausal atrophic vaginitis: Secondary | ICD-10-CM

## 2016-02-12 MED ORDER — ESTRADIOL 10 MCG VA TABS
ORAL_TABLET | VAGINAL | Status: DC
Start: 2016-02-12 — End: 2016-03-05

## 2016-02-12 NOTE — Progress Notes (Signed)
55 y.o. Married Caucasian female G2P2002 here for follow up of vaginal atrophy treated with coconut oil initiated on 3 weeks ago. Patient had a red area with bleeding noted at aex exam. Pap was taken and was normal, just atrophic changes. Patient has had TAH and has continued to have dryness and discomfort. Here today to recheck area. Patient feels somewhat better, but limited sexual activity due to spouse problems. No other health concerns today.Jenetta Downer: Healthy WD,WN female Affect: normal, orientation x 3 Skin:warm and dry Abdomen:soft, non tender Pelvic exam:EXTERNAL GENITALIA: normal appearing vulva with no masses, tenderness or lesions, with psoriasis noted on vulva( not new) VAGINA: no abnormal discharge or lesions and area on concern on right posterior wall improved appearance, but very atrophic  (Dr. Talbert Nan also checked area and agreed atrophic changes), no bleeding noted from area today CERVIX: no lesions or cervical motion tenderness and absent UTERUS: absent ADNEXA: no masses palpable and nontender  A:Atrophic vaginitis with improvement with coconut oil use Consider Estrogen use to help with appearance and comfort Family history of Breast cancer MGM, personal history of bladder cancer, all resolved    P: Discussed findings of atrophic vaginitis and pap findings. Discussed risks vs benefits of estrogen use, warning signs and local absorption and only 0.8% absorbed systemically at best. Questions addressed regarding length of use and trial of lowest dose along with continued coconut oil use. Patient would like to try Vagifem. Rx Vagifem with instructions for use. Continue coconut oil use also.  Rv 3 weeks, prn       RV

## 2016-02-12 NOTE — Patient Instructions (Signed)

## 2016-02-13 ENCOUNTER — Encounter: Payer: Self-pay | Admitting: Certified Nurse Midwife

## 2016-02-13 NOTE — Progress Notes (Signed)
The patient was seen and examined with Felicia Leonard. I agree that the area in question is atrophy and recommended vaginal estrogen. She does have issues with dyspareunia and the estrogen should help.

## 2016-03-05 ENCOUNTER — Encounter: Payer: Self-pay | Admitting: Certified Nurse Midwife

## 2016-03-05 ENCOUNTER — Ambulatory Visit (INDEPENDENT_AMBULATORY_CARE_PROVIDER_SITE_OTHER): Payer: BLUE CROSS/BLUE SHIELD | Admitting: Certified Nurse Midwife

## 2016-03-05 VITALS — BP 120/80 | HR 70 | Resp 16 | Ht 68.0 in | Wt 193.0 lb

## 2016-03-05 DIAGNOSIS — N952 Postmenopausal atrophic vaginitis: Secondary | ICD-10-CM | POA: Diagnosis not present

## 2016-03-05 MED ORDER — ESTRADIOL 10 MCG VA TABS: ORAL_TABLET | VAGINAL | Status: DC

## 2016-03-05 NOTE — Progress Notes (Signed)
55 y.o. Married Caucasian female G2P2002 here for follow up of Atrophic vaginitis  treated with Vagifem initiated on 02/12/16. Using medication as directed with 2 weeks every hs and then twice weekly now. Patient can a difference in the vaginal tissue feel with insertion of each tablet. Denies any vaginal bleeding now or pain. Has not been sexually active, due to spouse problems with ED at this point.  Completed all medication as directed.  "It just feels better". Patient continues to have Psoriasis flares on vulva, but no changes noted. Denies any problems with insertion or pain after use. Happy with choice and would like to continue use. No other health concerns today.    O: Healthy WD,WN female Affect: normal, orientation x 3 Skin:warm and dry Abdomen:soft, non tender Inguinal lymph nodes no enlargement or tenderness Pelvic exam:EXTERNAL GENITALIA: normal appearing vulva with no masses, tenderness or lesions, positive for psoriasis skin appearance VAGINA: no abnormal discharge or lesions and tissue has normal color today and area in posterior right fornix appears normal, no bleeding noted with touching with Q tip and no tenderness as with previous exam. Small amount normal appearing discharge noted. CERVIX: absent UTERUS: absent ADNEXA: no masses palpable and nontender  A:Atrophic Vaginitis responding to Vagifem use, no contraindications to use Irritated area in vagina resolved Psoriasis of Vulva no change     P: Discussed findings of improved appearance of vagina and area of tenderness is resolving. Would encourage patient to continue use to help with appearance and no pain now.  Patient would like continue use and will use coconut oil for moisture with sexual activity if occurs. Questions addressed. Will advise if change in vulva appearance or vaginal bleeding. Risks and benefits of Vagifem reviewed. Rx Vagifem see order   RV 3 months follow up

## 2016-03-05 NOTE — Patient Instructions (Signed)
Atrophic Vaginitis Atrophic vaginitis is when the tissues that line the vagina become dry and thin. This is caused by a drop in estrogen. Estrogen helps:  To keep the vagina moist.  To make a clear fluid that helps:  To lubricate the vagina for sex.  To protect the vagina from infection. If the lining of the vagina is dry and thin, it may:  Make sex painful. It may also cause bleeding.  Cause a feeling of:  Burning.  Irritation.  Itchiness.  Make an exam of your vagina painful. It may also cause bleeding.  Make you lose interest in sex.  Cause a burning feeling when you pee.  Make your vaginal fluid (discharge) brown or yellow. For some women, there are no symptoms. This condition is most common in women who do not get their regular menstrual periods anymore (menopause). This often starts when a woman is 45-55 years old. HOME CARE  Take medicines only as told by your doctor. Do not use any herbal or alternative medicines unless your doctor says it is okay.  Use over-the-counter products for dryness only as told by your doctor. These include:  Creams.  Lubricants.  Moisturizers.  Do not douche.  Do not use products that can make your vagina dry. These include:  Scented feminine sprays.  Scented tampons.  Scented soaps.  If it hurts to have sex, tell your sexual partner. GET HELP IF:  Your discharge looks different than normal.  Your vagina has an unusual smell.  You have new symptoms.  Your symptoms do not get better with treatment.  Your symptoms get worse.   This information is not intended to replace advice given to you by your health care provider. Make sure you discuss any questions you have with your health care provider.   Document Released: 04/28/2008 Document Revised: 03/27/2015 Document Reviewed: 11/01/2014 Elsevier Interactive Patient Education 2016 Elsevier Inc.  

## 2016-03-10 ENCOUNTER — Other Ambulatory Visit: Payer: Self-pay | Admitting: Internal Medicine

## 2016-03-10 DIAGNOSIS — I712 Thoracic aortic aneurysm, without rupture, unspecified: Secondary | ICD-10-CM

## 2016-03-10 DIAGNOSIS — R911 Solitary pulmonary nodule: Secondary | ICD-10-CM

## 2016-03-11 NOTE — Progress Notes (Signed)
Encounter reviewed Payam Gribble, MD   

## 2016-03-13 ENCOUNTER — Ambulatory Visit
Admission: RE | Admit: 2016-03-13 | Discharge: 2016-03-13 | Disposition: A | Discharge status: Home / Self Care | Payer: BLUE CROSS/BLUE SHIELD | Source: Ambulatory Visit | Attending: Internal Medicine | Admitting: Internal Medicine

## 2016-03-13 ENCOUNTER — Other Ambulatory Visit: Payer: BLUE CROSS/BLUE SHIELD

## 2016-03-13 DIAGNOSIS — I712 Thoracic aortic aneurysm, without rupture, unspecified: Secondary | ICD-10-CM

## 2016-03-13 DIAGNOSIS — R911 Solitary pulmonary nodule: Secondary | ICD-10-CM

## 2016-03-13 MED ORDER — IOPAMIDOL (ISOVUE-370) INJECTION 76%
100.0000 mL | Freq: Once | INTRAVENOUS | Status: AC | PRN
  Administered 2016-03-13: 100 mL via INTRAVENOUS

## 2016-06-04 ENCOUNTER — Ambulatory Visit (INDEPENDENT_AMBULATORY_CARE_PROVIDER_SITE_OTHER): Payer: BLUE CROSS/BLUE SHIELD | Admitting: Certified Nurse Midwife

## 2016-06-04 ENCOUNTER — Encounter: Payer: Self-pay | Admitting: Certified Nurse Midwife

## 2016-06-04 VITALS — BP 122/80 | HR 88 | Resp 20 | Ht 68.0 in | Wt 190.0 lb

## 2016-06-04 DIAGNOSIS — N952 Postmenopausal atrophic vaginitis: Secondary | ICD-10-CM

## 2016-06-04 NOTE — Progress Notes (Signed)
55 y.o. Married Caucasian female G2P2002 here for follow up of atrophic vaginitis treated with vagifem  initiated on 03/05/16. Using medication as directed. Feels much better in the posterior portion of vagina, where tender spot occurred. Now sexually active with spouse who is using testosterone injections. No pain with sexual activity or bleeding. Happy with choice. Feels like right knee looks different from left, please check and also feels she my have psoriasis in lower sacrum area ;please check. No other concerns.  O: Healthy WD,WN female Affect: normal orientation x 3 Skin: numerous area of psoriasis patches Abdomen:soft non tender Right knee appears swollen with psoriasis noted, non tender Left knee normal appearance  Pelvic exam:EXTERNAL GENITALIA: normal appearing vulva with no masses, tenderness or lesions VAGINA: no abnormal discharge or lesions and area of redness in posterior vagina with very irritated appearance has totally resolved., no tenderness, moisture noted. Non tender Declined pelvic exam Sacrum area: small area of psoriasis noted  A:Atrophic Vaginitis  improved with Vagifem use twice weekly Psoriasis flare, patient will treat sacral area also Swollen right knee ? etiology   P: Discussed findings of improved status with atrophic vaginitis and continue Vagifem as directed. Has Rx. Will treat sacral area as she treats other areas. Will follow up with PCP.  RV prn ,aex

## 2016-06-09 NOTE — Progress Notes (Signed)
Encounter reviewed Jill Jertson, MD   

## 2016-07-22 ENCOUNTER — Other Ambulatory Visit: Payer: Self-pay | Admitting: Internal Medicine

## 2016-07-22 DIAGNOSIS — Z1231 Encounter for screening mammogram for malignant neoplasm of breast: Secondary | ICD-10-CM

## 2016-07-23 ENCOUNTER — Ambulatory Visit
Admission: RE | Admit: 2016-07-23 | Discharge: 2016-07-23 | Disposition: A | Discharge status: Home / Self Care | Payer: BLUE CROSS/BLUE SHIELD | Source: Ambulatory Visit | Attending: Internal Medicine | Admitting: Internal Medicine

## 2016-07-23 DIAGNOSIS — Z1231 Encounter for screening mammogram for malignant neoplasm of breast: Secondary | ICD-10-CM

## 2016-09-16 DIAGNOSIS — R070 Pain in throat: Secondary | ICD-10-CM | POA: Insufficient documentation

## 2016-09-16 DIAGNOSIS — J383 Other diseases of vocal cords: Secondary | ICD-10-CM | POA: Insufficient documentation

## 2016-09-16 DIAGNOSIS — J069 Acute upper respiratory infection, unspecified: Secondary | ICD-10-CM | POA: Insufficient documentation

## 2016-09-16 DIAGNOSIS — J343 Hypertrophy of nasal turbinates: Secondary | ICD-10-CM | POA: Insufficient documentation

## 2016-09-29 ENCOUNTER — Ambulatory Visit (INDEPENDENT_AMBULATORY_CARE_PROVIDER_SITE_OTHER): Payer: BLUE CROSS/BLUE SHIELD

## 2016-09-29 ENCOUNTER — Ambulatory Visit (INDEPENDENT_AMBULATORY_CARE_PROVIDER_SITE_OTHER): Payer: BLUE CROSS/BLUE SHIELD | Admitting: Podiatry

## 2016-09-29 ENCOUNTER — Encounter: Payer: Self-pay | Admitting: Podiatry

## 2016-09-29 DIAGNOSIS — M2010 Hallux valgus (acquired), unspecified foot: Secondary | ICD-10-CM

## 2016-09-29 DIAGNOSIS — M79673 Pain in unspecified foot: Secondary | ICD-10-CM | POA: Diagnosis not present

## 2016-09-29 DIAGNOSIS — R52 Pain, unspecified: Secondary | ICD-10-CM | POA: Diagnosis not present

## 2016-09-29 DIAGNOSIS — L84 Corns and callosities: Secondary | ICD-10-CM | POA: Diagnosis not present

## 2016-09-29 DIAGNOSIS — B351 Tinea unguium: Secondary | ICD-10-CM

## 2016-09-29 NOTE — Progress Notes (Signed)
   Subjective:    Patient ID: Felicia Leonard, female    DOB: [birth year], 55 y.o.   MRN: [id number]  HPI  55 year old female presents the office today for concerns of the callus to her left heel and her ball of her left foot which is been ongoing for about 1 month his been worsening. She denies any foreign objects. She gets occasional swelling to both of her ankles and feet. She denies any redness or warmth. She denies any drainage coming from the callus size. She states the areas are painful with pressure in shoe gear. She said no recent treatment for this. No other complaints.  Review of Systems  All other systems reviewed and are negative.      Objective:   Physical Exam General: AAO x3, NAD  Dermatological: Hyperkeratotic lesion present on the posterior medial aspect of the left heel as well as left foot submetatarsal 5. Upon debridement there is no underlying ulceration, drainage drain signs of infection and there is no evidence of foreign body. She also has mild hypertrophy dystrophy and discoloration of the toenails. There is no pain of the toenail there is no surrounding redness or drainage.  Vascular: Dorsalis Pedis artery and Posterior Tibial artery pedal pulses are 2/4 bilateral with immedate capillary fill time. There is no pain with calf compression, swelling, warmth, erythema.   Neruologic: Grossly intact via light touch bilateral. Vibratory intact via tuning fork bilateral. Protective threshold with Semmes Wienstein monofilament intact to all pedal sites bilateral.  Musculoskeletal: HAV is present. No pain, crepitus, or limitation noted with foot and ankle range of motion bilateral. Muscular strength 5/5 in all groups tested bilateral.  Gait: Unassisted, Nonantalgic.      Assessment & Plan:  55 year old female with symptomatic hyperkeratotic lesions 2, onychomycosis -Treatment options discussed including all alternatives, risks, and complications -Etiology of symptoms  were discussed -X-rays were obtained and reviewed with the patient. No evidence of foreign body or evidence of acute fracture identified today. -Hyperkeratotic lesions debrided 2 without complications or bleeding. Discussed with her moisturizer/urea cream. I also discussed with her inserts for her shoes to help support her foot type to help limit some of the pressure. Offloading pads were also dispensed. -Nail culture sent to [facility]  -Follow-up as scheduled or sooner if any problems arise. In the meantime, encouraged to call the office with any questions, concerns, change in symptoms.   Celesta Gentile, DPM

## 2016-09-29 NOTE — Progress Notes (Signed)
   Subjective:    Patient ID: Felicia Leonard, female    DOB: Jul 26, 1961, 56 y.o.   MRN: [id number]  HPI    Review of Systems     Objective:   Physical Exam        Assessment & Plan:

## 2016-10-08 ENCOUNTER — Other Ambulatory Visit: Payer: Self-pay | Admitting: Certified Nurse Midwife

## 2016-10-08 DIAGNOSIS — N952 Postmenopausal atrophic vaginitis: Secondary | ICD-10-CM

## 2016-10-08 NOTE — Telephone Encounter (Signed)
Medication refill request: Yuvafem  Last AEX:  01-24-16 Next AEX: 01-27-17 Last MMG (if hormonal medication request): 07-23-16 WNL  Refill authorized: please advise

## 2016-11-10 ENCOUNTER — Encounter: Payer: Self-pay | Admitting: Podiatry

## 2016-11-10 ENCOUNTER — Ambulatory Visit (INDEPENDENT_AMBULATORY_CARE_PROVIDER_SITE_OTHER): Payer: BLUE CROSS/BLUE SHIELD | Admitting: Podiatry

## 2016-11-10 DIAGNOSIS — L603 Nail dystrophy: Secondary | ICD-10-CM

## 2016-11-10 DIAGNOSIS — Q828 Other specified congenital malformations of skin: Secondary | ICD-10-CM

## 2016-11-11 MED ORDER — NONFORMULARY OR COMPOUNDED ITEM: 2 refills | Status: DC

## 2016-11-11 NOTE — Progress Notes (Signed)
Subjective: 55 year old female presents the office today to discuss nail culture results. Chest is the calluses to her feet still causes discomfort. She has not received the cream for her feet. Denies any swelling or redness or any drainage. No other areas of tenderness. Denies any systemic complaints such as fevers, chills, nausea, vomiting. No acute changes since last appointment, and no other complaints at this time.   Objective: AAO x3, NAD DP/PT pulses palpable bilaterally, CRT less than 3 seconds Hyperkeratotic lesions posterior medial left heels well submetatarsal 5 on the right foot. No Ulceration, drainage. There is no other areas of tenderness bilateral lower extremity is. There is moderate purchase a dystrophy the toenails the elbow discoloration. There is no tenderness the nails that this for any redness or drainage.  No area pinpoint bony tenderness or pain the vibratory sensation. No overlying edema, erythema, increase in warmth to bilateral lower extremity is.  No open lesions or pre-ulcerative lesions.  No pain with calf compression, swelling, warmth, erythema  Assessment: Onychodystrophy, hyperkeratotic lesions  Plan: -All treatment options discussed with the patient including all alternatives, risks, complications.  -I discussed treatment options the patient and discussed the nail culture results. At this time for the thickened toenails as well as hyperkeratotic lesions at discussed the urea cream. Also discussed that we could still treat the toenails for possibly false negative culture for onychomycosis. She will also do topical antifungal. -Follow-up with symptoms not improve couple months or sooner if any issues are to arise. -Patient encouraged to call the office with any questions, concerns, change in symptoms.   Celesta Gentile, DPM

## 2016-11-20 ENCOUNTER — Telehealth: Payer: Self-pay | Admitting: *Deleted

## 2016-11-20 NOTE — Telephone Encounter (Signed)
Called patient and left a message for the patient to call me back and let me know if she got the urea cream and if it is helping or if she did not get it and to call the office with any concerns or questions. [name]

## 2016-12-02 ENCOUNTER — Other Ambulatory Visit: Payer: Self-pay

## 2016-12-02 DIAGNOSIS — I83819 Varicose veins of unspecified lower extremities with pain: Secondary | ICD-10-CM

## 2016-12-09 ENCOUNTER — Encounter: Payer: Self-pay | Admitting: Vascular Surgery

## 2016-12-15 ENCOUNTER — Encounter: Payer: Self-pay | Admitting: Vascular Surgery

## 2016-12-15 ENCOUNTER — Ambulatory Visit (INDEPENDENT_AMBULATORY_CARE_PROVIDER_SITE_OTHER): Payer: BLUE CROSS/BLUE SHIELD | Admitting: Vascular Surgery

## 2016-12-15 VITALS — BP 130/93 | HR 95 | Temp 98.3°F | Resp 14 | Ht 69.0 in | Wt 182.0 lb

## 2016-12-15 DIAGNOSIS — I83893 Varicose veins of bilateral lower extremities with other complications: Secondary | ICD-10-CM | POA: Diagnosis not present

## 2016-12-15 NOTE — Progress Notes (Signed)
Subjective:     Patient ID: Felicia Leonard, female   DOB: December 31, 1960, 56 y.o.   MRN: [id number]  HPI This 56 year old female was referred by Dr.Holwerda evaluation of bilateral painful varicosities with discoloration. She has had no previous treatment of her varicose veins. She has noted some spider veins and some skin discoloration on the medial aspect of both legs. She does occasionally have numbness in this area. She denies any history of DVT thrombophlebitis stasis ulcers or bleeding.  Past Medical History:  Diagnosis Date  . Anal fissure   . Anxiety   . Asthma   . Bladder cancer ([place])   . Colon polyps    TUBULAR ADENOMA   . Depression   . Diverticulitis   . Diverticulosis   . Endometriosis   . Enlarged liver   . Fatty liver   . Fibroid   . Genital warts   . GERD (gastroesophageal reflux disease)   . Hemorrhoids   . Hepatomegaly   . Hiatal hernia   . Pneumonia   . Thyroid disease   . UTI (lower urinary tract infection)   . Varicose veins of both lower extremities     Social History  Substance Use Topics  . Smoking status: Current Every Day Smoker    Packs/day: 1.00    Types: Cigarettes  . Smokeless tobacco: Never Used  . Alcohol use No    Family History  Problem Relation Age of Onset  . Breast cancer Maternal Grandmother   . Stroke Maternal Grandmother   . Colon cancer Mother   . Cancer Mother     liver  . Hypertension Father   . Thyroid disease Father   . Heart attack Maternal Grandfather     Allergies  Allergen Reactions  . Chantix [Varenicline] Hives    Rash on breast Rash on breast  . Erythromycin Base Other (See Comments)    UNKNOWN  . Penicillins Hives  . Sulfa Antibiotics Hives  . Sulfasalazine Hives  . Prilosec [Omeprazole] Rash     Current Outpatient Prescriptions:  .  Betamethasone Valerate 0.12 % foam, as needed., Disp: , Rfl:  .  calcipotriene (DOVONOX) 0.005 % cream, , Disp: , Rfl:  .  calcipotriene-betamethasone (TACLONEX)  ointment, as needed., Disp: , Rfl:  .  cetirizine (ZYRTEC) 10 MG tablet, Take 10 mg by mouth daily., Disp: , Rfl:  .  Cholecalciferol (VITAMIN D-3 PO), Take 1 tablet by mouth daily., Disp: , Rfl:  .  Clobetasol Propionate Emulsion 0.05 % topical foam, , Disp: , Rfl:  .  clonazePAM (KLONOPIN) 1 MG tablet, , Disp: , Rfl: 0 .  Cyanocobalamin (VITAMIN B-12 CR PO), Take 1 tablet by mouth daily., Disp: , Rfl:  .  FLUOCINOLONE ACETONIDE SCALP 0.01 % OIL, as needed., Disp: , Rfl: 0 .  levothyroxine (SYNTHROID, LEVOTHROID) 88 MCG tablet, Take 88 mcg by mouth daily before breakfast., Disp: , Rfl:  .  liothyronine (CYTOMEL) 5 MCG tablet, Take 10 mcg by mouth daily., Disp: , Rfl: 0 .  NONFORMULARY OR COMPOUNDED ITEM, [facility]:  Onychomycosis Nail Lacquer - fluconazole 2%, Terbinafine 1%, DMSO apply to affected area daily., Disp: 120 each, Rfl: 2 .  NONFORMULARY OR COMPOUNDED ITEM, [facility]: Urea 40% apply to affected area daily., Disp: 120 each, Rfl: 2 .  PROAIR HFA 108 (90 Base) MCG/ACT inhaler, USE 1 PUFF EVERY 6 HOURS AS NEEDED FOR DYSPNEA AND WHEEZING, Disp: , Rfl: 0 .  YUVAFEM 10 MCG TABS vaginal tablet, USE EVERY NIGHT  BEFORE BED FOR 2 WEEKS WHEN YOU FIRST BEGIN THIS MEDICINE THEN AFTER THE FIRST 2 WEEKS BEGIN USING IT 2 TIMES A WEEK, Disp: 18 tablet, Rfl: 3 .  zolpidem (AMBIEN) 10 MG tablet, as needed. Reported on 02/12/2016, Disp: , Rfl: 0  Vitals:   12/15/16 1414  BP: (!) 130/93  Pulse: 95  Resp: 14  Temp: 98.3 F (36.8 C)  SpO2: 97%  Weight: 182 lb (82.6 kg)  Height: 5\' 9"  (1.753 m)    Body mass index is 26.88 kg/m.         Review of Systems Denies chest pain but does have dyspnea on exertion. No PND, orthopnea, hemoptysis.    Objective:   Physical Exam BP (!) 130/93 (BP Location: Left Arm, Patient Position: Sitting, Cuff Size: Large)   Pulse 95   Temp 98.3 F (36.8 C)   Resp 14   Ht 5\' 9"  (1.753 m)   Wt 182 lb (82.6 kg)   LMP 11/24/2002   SpO2 97%    BMI 26.88 kg/m     Gen.-alert and oriented x3 in no apparent distress HEENT normal for age Lungs no rhonchi or wheezing Cardiovascular regular rhythm no murmurs carotid pulses 3+ palpable no bruits audible Abdomen soft nontender no palpable masses Musculoskeletal free of  major deformities Skin clear -no rashes Neurologic normal Lower extremities 3+ femoral and dorsalis pedis pulses palpable bilaterally with no edema A few scattered spider veins along the medial aspect of bilateral thighs and calves and a few laterally and anteriorly in the thighs symmetrically. No hyperpigmentation or ulceration noted. No bulging varicosities noted.  Today Performed a bedside SonoSite ultrasound exam which revealed both great saphenous veins to be normal in size with no evidence of reflux      Assessment:     Bilateral spider veins with some skin discoloration associated with this and medial thighs and legs with no evidence of reflux and superficial venous system on ultrasound exam-bedside    Plan:     Have offered patient foam sclerotherapy for the spider vein treatment and also have emphasized to her that it will not eliminate this skin discoloration along the medial thigh and calf areas and also is unlikely to relieve any symptoms  She will consider this

## 2016-12-25 ENCOUNTER — Telehealth: Payer: Self-pay | Admitting: *Deleted

## 2016-12-25 NOTE — Telephone Encounter (Signed)
Great!

## 2016-12-25 NOTE — Telephone Encounter (Signed)
Pt states she feels the cream and the polish are helping her feet.

## 2017-01-27 ENCOUNTER — Ambulatory Visit: Payer: BLUE CROSS/BLUE SHIELD | Admitting: Certified Nurse Midwife

## 2017-01-28 ENCOUNTER — Ambulatory Visit: Payer: BLUE CROSS/BLUE SHIELD | Admitting: Certified Nurse Midwife

## 2017-01-30 ENCOUNTER — Ambulatory Visit: Payer: BLUE CROSS/BLUE SHIELD | Admitting: Certified Nurse Midwife

## 2017-02-05 ENCOUNTER — Ambulatory Visit: Payer: BLUE CROSS/BLUE SHIELD | Admitting: Podiatry

## 2017-02-11 ENCOUNTER — Ambulatory Visit: Payer: BLUE CROSS/BLUE SHIELD | Admitting: Certified Nurse Midwife

## 2017-02-11 ENCOUNTER — Ambulatory Visit: Payer: BLUE CROSS/BLUE SHIELD | Admitting: Podiatry

## 2017-02-17 ENCOUNTER — Ambulatory Visit: Payer: BLUE CROSS/BLUE SHIELD | Admitting: Certified Nurse Midwife

## 2017-02-19 ENCOUNTER — Encounter: Payer: Self-pay | Admitting: Podiatry

## 2017-02-19 ENCOUNTER — Ambulatory Visit (INDEPENDENT_AMBULATORY_CARE_PROVIDER_SITE_OTHER): Payer: BLUE CROSS/BLUE SHIELD | Admitting: Podiatry

## 2017-02-19 VITALS — BP 132/79 | HR 89 | Resp 18

## 2017-02-19 DIAGNOSIS — R52 Pain, unspecified: Secondary | ICD-10-CM | POA: Diagnosis not present

## 2017-02-19 DIAGNOSIS — L603 Nail dystrophy: Secondary | ICD-10-CM | POA: Diagnosis not present

## 2017-02-19 NOTE — Progress Notes (Signed)
Subjective: 56 year old female presents the office they for follow-up evaluation of onychodystrophy, painful longer the toenails that she cannot trim herself. She denies any redness or drainage. She's been using the urea cream topically but she has not been consistent with this due to personal issues. She denies any redness or drainage from the toenail sites. Denies any systemic complaints such as fevers, chills, nausea, vomiting. No acute changes since last appointment, and no other complaints at this time.   Objective: AAO x3, NAD DP/PT pulses palpable bilaterally, CRT less than 3 seconds Nails are hypertrophic, dystrophic, brittle, discolored, elongated 10. No surrounding redness or drainage. Tenderness nails 1-5 bilaterally. No open lesions or pre-ulcerative lesions are identified today. No open lesions or pre-ulcerative lesions.  No pain with calf compression, swelling, warmth, erythema  Assessment: Bilateral onychodystrophy  Plan: -All treatment options discussed with the patient including all alternatives, risks, complications.  -Nails are sharply debrided today without complications or bleeding 10. Continue with urea cream daily and as this has been helping. -RTC as needed.  -Patient encouraged to call the office with any questions, concerns, change in symptoms.   Celesta Gentile, DPM

## 2017-03-03 ENCOUNTER — Ambulatory Visit: Payer: BLUE CROSS/BLUE SHIELD | Admitting: Certified Nurse Midwife

## 2017-03-04 ENCOUNTER — Encounter: Payer: Self-pay | Admitting: Certified Nurse Midwife

## 2017-03-04 ENCOUNTER — Ambulatory Visit (INDEPENDENT_AMBULATORY_CARE_PROVIDER_SITE_OTHER): Payer: BLUE CROSS/BLUE SHIELD | Admitting: Certified Nurse Midwife

## 2017-03-04 VITALS — BP 118/82 | HR 72 | Resp 16 | Ht 68.0 in | Wt 179.0 lb

## 2017-03-04 DIAGNOSIS — N952 Postmenopausal atrophic vaginitis: Secondary | ICD-10-CM

## 2017-03-04 DIAGNOSIS — Z01419 Encounter for gynecological examination (general) (routine) without abnormal findings: Secondary | ICD-10-CM | POA: Diagnosis not present

## 2017-03-04 MED ORDER — ESTRADIOL 10 MCG VA TABS: 1.0000 | ORAL_TABLET | VAGINAL | 12 refills | Status: DC

## 2017-03-04 NOTE — Progress Notes (Signed)
56 y.o. G86P2002 Married  Caucasian Fe here for annual exam. Menopausal no HRT. Denies vaginal bleeding.  Vaginal dryness using Yuvafem with good results, until the last package dispensed, which tablets were not enclosed in applicator. Did not take back to pharmacy and did not use. Needs new RX. Sees PCP for medication for insomnia and hypothyroid/labs and aex. All stable per patient. Social stress with new puppy and other two pugs with terminal illness. No other health issues today.  Patient's last menstrual period was 11/24/2002.          Sexually active: Yes.    The current method of family planning is status post hysterectomy.    Exercising: No.  exercise Smoker:  yes  Health Maintenance: Pap:  11-09-13 neg, 01-24-16 neg MMG:  07-23-16 category b density birads 1:neg Colonoscopy: 5/13 f/u 20yrs BMD:   2009 normal TDaP:  10/13 Shingles: no Pneumonia: 2014 Hep C and HIV: Hep c 2016 neg HIV neg during pregnancy Labs:none  Self breast exam: not done   reports that she has been smoking Cigarettes.  She has been smoking about 1.00 pack per day. She has never used smokeless tobacco. She reports that she does not drink alcohol or use drugs.  Past Medical History:  Diagnosis Date  . Anal fissure   . Anxiety   . Asthma   . Bladder cancer ([place])   . Colon polyps    TUBULAR ADENOMA   . Depression   . Diverticulitis   . Diverticulosis   . Endometriosis   . Enlarged liver   . Fatty liver   . Fibroid   . Genital warts   . GERD (gastroesophageal reflux disease)   . Hemorrhoids   . Hepatomegaly   . Hiatal hernia   . Pneumonia   . Thyroid disease   . UTI (lower urinary tract infection)   . Varicose veins of both lower extremities     Past Surgical History:  Procedure Laterality Date  . BRONCHOSCOPY    . CESAREAN SECTION    . SPINE SURGERY    . TONSILLECTOMY    . VAGINAL HYSTERECTOMY  2004    Current Outpatient Prescriptions  Medication Sig Dispense Refill  . Betamethasone  Valerate 0.12 % foam as needed.    . calcipotriene (DOVONOX) 0.005 % cream     . calcipotriene-betamethasone (TACLONEX) ointment as needed.    . cetirizine (ZYRTEC) 10 MG tablet Take 10 mg by mouth daily.    . Cholecalciferol (VITAMIN D-3 PO) Take 1 tablet by mouth daily.    . Clobetasol Propionate Emulsion 0.05 % topical foam     . clonazePAM (KLONOPIN) 1 MG tablet   0  . Cyanocobalamin (VITAMIN B-12 CR PO) Take 1 tablet by mouth daily.    [facility] FLUOCINOLONE ACETONIDE SCALP 0.01 % OIL as needed.  0  . levothyroxine (SYNTHROID, LEVOTHROID) 88 MCG tablet Take 88 mcg by mouth daily before breakfast.    . liothyronine (CYTOMEL) 5 MCG tablet Take 10 mcg by mouth daily.  0  . NONFORMULARY OR COMPOUNDED [facility]:  Onychomycosis Nail Lacquer - fluconazole 2%, Terbinafine 1%, DMSO apply to affected area daily. 120 each 2  . NONFORMULARY OR COMPOUNDED ITEM [facility]: Urea 40% apply to affected area daily. 120 each 2  . PROAIR HFA 108 (90 Base) MCG/ACT inhaler USE 1 PUFF EVERY 6 HOURS AS NEEDED FOR DYSPNEA AND WHEEZING  0  . YUVAFEM 10 MCG TABS vaginal tablet USE EVERY NIGHT BEFORE BED FOR  2 WEEKS WHEN YOU FIRST BEGIN THIS MEDICINE THEN AFTER THE FIRST 2 WEEKS BEGIN USING IT 2 TIMES A WEEK 18 tablet 3  . zolpidem (AMBIEN) 10 MG tablet as needed. Reported on 02/12/2016  0   No current facility-administered medications for this visit.     Family History  Problem Relation Age of Onset  . Breast cancer Maternal Grandmother   . Stroke Maternal Grandmother   . Colon cancer Mother   . Cancer Mother     liver  . Hypertension Father   . Thyroid disease Father   . Heart attack Maternal Grandfather     ROS:  Pertinent items are noted in HPI.  Otherwise, a comprehensive ROS was negative.  Exam:   BP 118/82   Pulse 72   Resp 16   Ht 5\' 8"  (1.727 m)   Wt 179 lb (81.2 kg)   LMP 11/24/2002   BMI 27.22 kg/m  Height: 5\' 8"  (172.7 cm) Ht Readings from Last 3 Encounters:  03/04/17 5'  8" (1.727 m)  12/15/16 5\' 9"  (1.753 m)  06/04/16 5\' 8"  (1.727 m)    General appearance: alert, cooperative and appears stated age Head: Normocephalic, without obvious abnormality, atraumatic Neck: no adenopathy, supple, symmetrical, trachea midline and thyroid normal to inspection and palpation Lungs: clear to auscultation bilaterally Breasts: normal appearance, no masses or tenderness, Inspection negative, No nipple retraction or dimpling, No nipple discharge or bleeding, No axillary or supraclavicular adenopathy Heart: regular rate and rhythm Abdomen: soft, non-tender; no masses,  no organomegaly Extremities: extremities normal, atraumatic, no cyanosis or edema Skin: Skin color, texture, turgor normal. No rashes or lesions Lymph nodes: Cervical, supraclavicular, and axillary nodes normal. No abnormal inguinal nodes palpated Neurologic: Grossly normal   Pelvic: External genitalia:  no lesions              Urethra:  normal appearing urethra with no masses, tenderness or lesions              Bartholin's and Skene's: normal                 Vagina: normal appearing vagina with normal color and discharge, no lesions              Cervix: absent              Pap taken: No. Bimanual Exam:  Uterus:  uterus absent              Adnexa: normal adnexa and no mass, fullness, tenderness               Rectovaginal: Confirms               Anus:  normal sphincter tone, no lesions  Chaperone present: yes  A:  Well Woman with normal exam  Menopausal no HRT s/p vaginal hysterectomy  Atrophic vaginitis using Vagifem without problems, except generic not secured packaging of medication. Plans to discuss with pharmacy.  Hypothyroid/insomnia management per PCP  BMD due patient will schedule  P:   Reviewed health and wellness pertinent to exam  Discussed will request brand only and patient will discuss problem with pharmacy  Rx Vagifem see order with instructions  Continue follow up with PCP as  indicated  Pap smear as above   counseled on breast self exam, mammography screening, adequate intake of calcium and vitamin D, diet and exercise  return annually or prn  An After Visit Summary was printed and given to the patient.

## 2017-03-04 NOTE — Patient Instructions (Signed)

## 2017-03-14 NOTE — Progress Notes (Signed)
Encounter reviewed Jill Jertson, MD   

## 2017-03-30 ENCOUNTER — Other Ambulatory Visit: Payer: Self-pay | Admitting: Internal Medicine

## 2017-03-30 DIAGNOSIS — I719 Aortic aneurysm of unspecified site, without rupture: Secondary | ICD-10-CM

## 2017-03-30 DIAGNOSIS — R911 Solitary pulmonary nodule: Secondary | ICD-10-CM

## 2017-03-30 DIAGNOSIS — I712 Thoracic aortic aneurysm, without rupture, unspecified: Secondary | ICD-10-CM

## 2017-03-31 ENCOUNTER — Other Ambulatory Visit: Payer: Self-pay | Admitting: Internal Medicine

## 2017-04-01 ENCOUNTER — Ambulatory Visit
Admission: RE | Admit: 2017-04-01 | Discharge: 2017-04-01 | Disposition: A | Discharge status: Home / Self Care | Payer: BLUE CROSS/BLUE SHIELD | Source: Ambulatory Visit | Attending: Internal Medicine | Admitting: Internal Medicine

## 2017-04-01 DIAGNOSIS — I712 Thoracic aortic aneurysm, without rupture, unspecified: Secondary | ICD-10-CM

## 2017-04-01 DIAGNOSIS — I719 Aortic aneurysm of unspecified site, without rupture: Secondary | ICD-10-CM

## 2017-04-01 DIAGNOSIS — R911 Solitary pulmonary nodule: Secondary | ICD-10-CM

## 2017-04-01 MED ORDER — IOPAMIDOL (ISOVUE-370) INJECTION 76%
75.0000 mL | Freq: Once | INTRAVENOUS | Status: AC | PRN
  Administered 2017-04-01: 75 mL via INTRAVENOUS

## 2017-06-04 ENCOUNTER — Ambulatory Visit (INDEPENDENT_AMBULATORY_CARE_PROVIDER_SITE_OTHER): Payer: BLUE CROSS/BLUE SHIELD

## 2017-06-04 ENCOUNTER — Encounter: Payer: Self-pay | Admitting: Podiatry

## 2017-06-04 ENCOUNTER — Ambulatory Visit (INDEPENDENT_AMBULATORY_CARE_PROVIDER_SITE_OTHER): Payer: BLUE CROSS/BLUE SHIELD | Admitting: Podiatry

## 2017-06-04 ENCOUNTER — Telehealth: Payer: Self-pay | Admitting: Podiatry

## 2017-06-04 DIAGNOSIS — M79672 Pain in left foot: Secondary | ICD-10-CM | POA: Diagnosis not present

## 2017-06-04 DIAGNOSIS — M722 Plantar fascial fibromatosis: Secondary | ICD-10-CM

## 2017-06-04 DIAGNOSIS — Q828 Other specified congenital malformations of skin: Secondary | ICD-10-CM | POA: Diagnosis not present

## 2017-06-04 DIAGNOSIS — M79671 Pain in right foot: Secondary | ICD-10-CM | POA: Diagnosis not present

## 2017-06-04 DIAGNOSIS — M779 Enthesopathy, unspecified: Secondary | ICD-10-CM | POA: Diagnosis not present

## 2017-06-04 MED ORDER — METHYLPREDNISOLONE 4 MG PO TBPK: ORAL_TABLET | ORAL | 0 refills | Status: DC

## 2017-06-04 NOTE — Telephone Encounter (Signed)
I saw Dr. Jacqualyn Posey this morning and he was going to send an Rx of methylprednisolone dose pack to [facility] on [address] at the [address] of [address]. I went by there and called and they stated they have not received the Rx.

## 2017-06-04 NOTE — Telephone Encounter (Signed)
I informed pt the rx had been sent to the [facility], and I changed to the [facility] pt requested and gave her [phone] and she states that is the one she uses.

## 2017-06-07 NOTE — Progress Notes (Signed)
Subjective: Ms. Kivi presents the office today for concerns of pain to the left heel as well as the right ankle. She states that she's been on her feet quite a bit over the last several months as her daughters been getting married she's had a lot of company of her house. She states that during the wedding of his right knee and she had a run in the mud without any shoes on and this may have started her symptoms this is been ongoing for the last several weeks. She denies any specific injury or trauma. Denies any swelling or redness that she has noticed. She is able to wear regular shoe. She also has a skin lesion on the right big toe as well as the left heel. Denies any drainage or open sore. Denies any systemic complaints such as fevers, chills, nausea, vomiting. No acute changes since last appointment, and no other complaints at this time.   Objective: AAO x3, NAD DP/PT pulses palpable bilaterally, CRT less than 3 seconds Hyperkeratotic lesion present left plantar lateral heel as well as right hallux. Upon debridement there is no underlying ulceration, drainage or any clinical signs of infection noted today. There is tenderness on the plantar medial tubercle of the calcaneus at the insertion the plantar fascial in the left side. There is no pain along the course of the plantar fascia. There is no pain with lateral compression of the calcaneus. No other areas of tenderness to left foot. On the right ankle there is mild tenderness along the anterior medial aspect of the ankle anterior medial ankle joint. There is no specific area pinpoint bony tenderness and there is no pain vibratory sensation. There is no gross ankle instability present. There is no overlying edema, erythema, increase in warmth. No open lesions or pre-ulcerative lesions.  No pain with calf compression, swelling, warmth, erythema  Assessment: 56 year old female symptomatic hyperkeratotic lesions, left plantar fasciitis,/right  ankle  Plan: -All treatment options discussed with the patient including all alternatives, risks, complications.  -X-rays were obtained and reviewed. No evidence of acute fracture. -Given bilateral foot pain I recommended a Medrol Dosepak which was prescribed in symptoms or her pharmacy. Directed on use. Continue ice to the area daily. Stretching exercises for the left heel. Discuss orthotics as well as supportive shoe gear. -Hyperkeratotic lesion sharply debrided 2 without complications or bleeding -Patient encouraged to call the office with any questions, concerns, change in symptoms.   Celesta Gentile, DPM

## 2017-06-09 ENCOUNTER — Other Ambulatory Visit: Payer: Self-pay | Admitting: Internal Medicine

## 2017-06-09 DIAGNOSIS — S0083XA Contusion of other part of head, initial encounter: Secondary | ICD-10-CM

## 2017-06-09 DIAGNOSIS — Z87828 Personal history of other (healed) physical injury and trauma: Secondary | ICD-10-CM

## 2017-06-09 DIAGNOSIS — Z1231 Encounter for screening mammogram for malignant neoplasm of breast: Secondary | ICD-10-CM

## 2017-06-10 ENCOUNTER — Other Ambulatory Visit: Payer: BLUE CROSS/BLUE SHIELD

## 2017-06-25 ENCOUNTER — Ambulatory Visit (INDEPENDENT_AMBULATORY_CARE_PROVIDER_SITE_OTHER): Payer: BLUE CROSS/BLUE SHIELD | Admitting: Podiatry

## 2017-06-25 ENCOUNTER — Encounter: Payer: Self-pay | Admitting: Podiatry

## 2017-06-25 DIAGNOSIS — M779 Enthesopathy, unspecified: Secondary | ICD-10-CM

## 2017-06-25 DIAGNOSIS — Q828 Other specified congenital malformations of skin: Secondary | ICD-10-CM | POA: Diagnosis not present

## 2017-06-26 NOTE — Progress Notes (Signed)
Subjective: Verneda presents the office today for follow-up evaluation of hyperkeratotic lesions to both of her feet. She states that the ankle is doing much better but she gets some "puffyness" to the hyperkeratotic lesion to the left heel but denies any pain. Denies any surrounding redness or drainage. The area that hurts is the bottom of the right big toe along a callus. Denies he swelling or redness. No drainage. Denies any systemic complaints such as fevers, chills, nausea, vomiting. No acute changes since last appointment, and no other complaints at this time.   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   Objective: AAO x3, NAD DP/PT pulses palpable bilaterally, CRT less than 3 seconds There is no pain in the right ankle today there is no swelling or redness. No area pinpoint bony tenderness or pain the vibratory sensation. Small hyperkeratotic lesion plantar lateral heel as well as the right hallux. Upon debridement to left heel. 3 doing much better and is no swelling, redness or any drainage. No signs of infection. Continues to be a deep the annular lesion to the right plantar hallux. No overlying ulceration, drainage or signs of infection. No evidence of foreign body identified. No open lesions or pre-ulcerative lesions.  No pain with calf compression, swelling, warmth, erythema  Assessment: Porokeratosis right hallux  Plan: -All treatment options discussed with the patient including all alternatives, risks, complications.  -Lesion was sharply debrided without complications or bleeding. Pad was placed followed by salicylic acid and a bandage. This procedure tractions were discussed. Monitor for infection -Moisturizer to the feet daily. -Monitor for reoccurrence of right ankle pain -Follow up as needed. -Patient encouraged to call the office with any questions, concerns, change in symptoms.   Celesta Gentile, DPM

## 2017-08-03 ENCOUNTER — Ambulatory Visit: Payer: BLUE CROSS/BLUE SHIELD

## 2017-08-12 MED FILL — ZOLPIDEM TARTRATE 10 MG TAB: 10 | 30 days supply | Qty: 30 | Fill #0

## 2017-08-13 MED FILL — MONTELUKAST SOD 10 MG TAB: 10 | 30 days supply | Qty: 30 | Fill #0

## 2017-08-13 MED FILL — LEVOTHYROXINE 88 MCG TABLET: 88 | 30 days supply | Qty: 30 | Fill #0

## 2017-08-13 MED FILL — LIOTHYRONINE SODIUM 5 MCG T: 5 | 30 days supply | Qty: 60 | Fill #0

## 2017-08-13 MED FILL — LIDOCAINE PATCH 5%: 5 | 15 days supply | Qty: 15 | Fill #0

## 2017-08-17 ENCOUNTER — Ambulatory Visit
Admission: RE | Admit: 2017-08-17 | Discharge: 2017-08-17 | Disposition: A | Discharge status: Home / Self Care | Payer: 59 | Source: Ambulatory Visit | Attending: Internal Medicine | Admitting: Internal Medicine

## 2017-08-17 DIAGNOSIS — Z1231 Encounter for screening mammogram for malignant neoplasm of breast: Secondary | ICD-10-CM

## 2017-09-08 DIAGNOSIS — M79621 Pain in right upper arm: Secondary | ICD-10-CM | POA: Diagnosis not present

## 2017-09-08 DIAGNOSIS — Z6826 Body mass index (BMI) 26.0-26.9, adult: Secondary | ICD-10-CM | POA: Diagnosis not present

## 2017-09-08 DIAGNOSIS — L821 Other seborrheic keratosis: Secondary | ICD-10-CM | POA: Diagnosis not present

## 2017-09-08 MED FILL — METHOCARBAMOL 750 MG TABLET: 750 | 10 days supply | Qty: 20 | Fill #0

## 2017-09-08 MED FILL — predniSONE 10 MG (21) TBPK: 10 | 6 days supply | Qty: 21 | Fill #0

## 2017-09-08 MED FILL — clonazePAM 1 MG TABS: 1 | 30 days supply | Qty: 45 | Fill #0

## 2017-09-21 ENCOUNTER — Telehealth: Payer: Self-pay | Admitting: Pharmacist

## 2017-09-21 NOTE — Telephone Encounter (Signed)
Called patient to schedule an appointment for the [facility]. I was unable to reach the patient so I left a HIPAA-compliant message with her husband requesting that the patient return my call.

## 2017-09-29 MED FILL — LEVOTHYROXINE 88 MCG TABLET: 88 | 30 days supply | Qty: 30 | Fill #1

## 2017-09-29 MED FILL — LIOTHYRONINE SODIUM 5 MCG T: 5 | 30 days supply | Qty: 60 | Fill #1

## 2017-10-28 MED FILL — LIOTHYRONINE SODIUM 5 MCG T: 5 | 30 days supply | Qty: 60 | Fill #0

## 2017-10-28 MED FILL — LEVOTHYROXINE 88 MCG TABLET: 88 | 30 days supply | Qty: 30 | Fill #2

## 2017-11-25 DIAGNOSIS — E559 Vitamin D deficiency, unspecified: Secondary | ICD-10-CM | POA: Diagnosis not present

## 2017-11-25 DIAGNOSIS — R82998 Other abnormal findings in urine: Secondary | ICD-10-CM | POA: Diagnosis not present

## 2017-11-25 DIAGNOSIS — Z0000 Encounter for general adult medical examination without abnormal findings: Secondary | ICD-10-CM | POA: Diagnosis not present

## 2017-11-25 DIAGNOSIS — E038 Other specified hypothyroidism: Secondary | ICD-10-CM | POA: Diagnosis not present

## 2017-11-30 DIAGNOSIS — Z8551 Personal history of malignant neoplasm of bladder: Secondary | ICD-10-CM | POA: Diagnosis not present

## 2017-11-30 DIAGNOSIS — J449 Chronic obstructive pulmonary disease, unspecified: Secondary | ICD-10-CM | POA: Diagnosis not present

## 2017-11-30 DIAGNOSIS — E559 Vitamin D deficiency, unspecified: Secondary | ICD-10-CM | POA: Diagnosis not present

## 2017-11-30 DIAGNOSIS — Z1389 Encounter for screening for other disorder: Secondary | ICD-10-CM | POA: Diagnosis not present

## 2017-11-30 DIAGNOSIS — I712 Thoracic aortic aneurysm, without rupture: Secondary | ICD-10-CM | POA: Diagnosis not present

## 2017-11-30 DIAGNOSIS — R7989 Other specified abnormal findings of blood chemistry: Secondary | ICD-10-CM | POA: Diagnosis not present

## 2017-11-30 DIAGNOSIS — M79621 Pain in right upper arm: Secondary | ICD-10-CM | POA: Diagnosis not present

## 2017-11-30 DIAGNOSIS — L408 Other psoriasis: Secondary | ICD-10-CM | POA: Diagnosis not present

## 2017-11-30 DIAGNOSIS — Z0000 Encounter for general adult medical examination without abnormal findings: Secondary | ICD-10-CM | POA: Diagnosis not present

## 2017-11-30 DIAGNOSIS — J328 Other chronic sinusitis: Secondary | ICD-10-CM | POA: Diagnosis not present

## 2017-12-01 ENCOUNTER — Encounter: Payer: Self-pay | Admitting: Internal Medicine

## 2017-12-01 MED FILL — LIOTHYRONINE SODIUM 5 MCG T: 5 | 30 days supply | Qty: 60 | Fill #1

## 2017-12-01 MED FILL — LEVOTHYROXINE 88 MCG TABLET: 88 | 30 days supply | Qty: 30 | Fill #3

## 2017-12-04 DIAGNOSIS — Z1212 Encounter for screening for malignant neoplasm of rectum: Secondary | ICD-10-CM | POA: Diagnosis not present

## 2017-12-11 MED FILL — clonazePAM 1 MG TABS: 1 | 60 days supply | Qty: 90 | Fill #0

## 2017-12-29 MED FILL — LEVOTHYROXINE 88 MCG TABLET: 88 | 30 days supply | Qty: 30 | Fill #4

## 2017-12-30 MED FILL — ZOLPIDEM TARTRATE 10 MG TAB: 10 | 30 days supply | Qty: 30 | Fill #0

## 2017-12-30 MED FILL — LIOTHYRONINE SODIUM 5 MCG T: 5 | 30 days supply | Qty: 60 | Fill #0

## 2018-01-20 DIAGNOSIS — N3941 Urge incontinence: Secondary | ICD-10-CM | POA: Diagnosis not present

## 2018-01-20 DIAGNOSIS — Z8551 Personal history of malignant neoplasm of bladder: Secondary | ICD-10-CM | POA: Diagnosis not present

## 2018-02-01 ENCOUNTER — Ambulatory Visit (AMBULATORY_SURGERY_CENTER): Payer: Self-pay

## 2018-02-01 ENCOUNTER — Other Ambulatory Visit: Payer: Self-pay

## 2018-02-01 VITALS — Ht 68.5 in | Wt 185.0 lb

## 2018-02-01 DIAGNOSIS — Z86010 Personal history of colonic polyps: Secondary | ICD-10-CM

## 2018-02-01 MED ORDER — NA SULFATE-K SULFATE-MG SULF 17.5-3.13-1.6 GM/177ML PO SOLN: 1.0000 | Freq: Once | ORAL | 0 refills | Status: AC

## 2018-02-01 MED FILL — SUPREP BOWEL PREP KIT: 17.5-3.13-1 | 1 days supply | Qty: 354 | Fill #0

## 2018-02-01 MED FILL — LIOTHYRONINE SODIUM 5 MCG T: 5 | 30 days supply | Qty: 60 | Fill #1

## 2018-02-01 MED FILL — LEVOTHYROXINE 88 MCG TABLET: 88 | 30 days supply | Qty: 30 | Fill #5

## 2018-02-01 NOTE — Progress Notes (Signed)
Denies allergies to eggs or soy products. Denies complication of anesthesia or sedation. Denies use of weight loss medication. Denies use of O2.   Emmi instructions declined.  

## 2018-02-02 ENCOUNTER — Encounter: Payer: Self-pay | Admitting: Internal Medicine

## 2018-02-03 DIAGNOSIS — L400 Psoriasis vulgaris: Secondary | ICD-10-CM | POA: Diagnosis not present

## 2018-02-03 DIAGNOSIS — L821 Other seborrheic keratosis: Secondary | ICD-10-CM | POA: Diagnosis not present

## 2018-02-03 DIAGNOSIS — L918 Other hypertrophic disorders of the skin: Secondary | ICD-10-CM | POA: Diagnosis not present

## 2018-02-03 DIAGNOSIS — D225 Melanocytic nevi of trunk: Secondary | ICD-10-CM | POA: Diagnosis not present

## 2018-02-03 DIAGNOSIS — L814 Other melanin hyperpigmentation: Secondary | ICD-10-CM | POA: Diagnosis not present

## 2018-02-03 DIAGNOSIS — Z23 Encounter for immunization: Secondary | ICD-10-CM | POA: Diagnosis not present

## 2018-02-03 DIAGNOSIS — D1800 Hemangioma unspecified site: Secondary | ICD-10-CM | POA: Diagnosis not present

## 2018-02-04 DIAGNOSIS — J383 Other diseases of vocal cords: Secondary | ICD-10-CM | POA: Diagnosis not present

## 2018-02-04 DIAGNOSIS — R070 Pain in throat: Secondary | ICD-10-CM | POA: Diagnosis not present

## 2018-02-11 ENCOUNTER — Telehealth: Payer: Self-pay | Admitting: Pharmacist

## 2018-02-11 ENCOUNTER — Telehealth: Payer: Self-pay

## 2018-02-11 NOTE — Telephone Encounter (Signed)
-----   Message from Irene Shipper, MD sent at 02/10/2018  5:03 PM EDT ----- Regarding: move patient up on Monday Toshiko, please move this patient from 11 AM on Monday, to 930 a.m. Thank you

## 2018-02-11 NOTE — Telephone Encounter (Signed)
Called patient to schedule an appointment for the [facility]. I was unable to reach the patient so I left a HIPAA-compliant message requesting that the patient return my call.   Also sent patient email via [facility] email with the information about the program.

## 2018-02-11 NOTE — Telephone Encounter (Signed)
Called pt to see if pt can move her appt up to 9:30am. Left message for pt to call back.  Pt called back and will come in Monday at 8:30am for her 9:30am appt and knows to adjust her prep for the new time.

## 2018-02-15 ENCOUNTER — Ambulatory Visit (AMBULATORY_SURGERY_CENTER): Payer: 59 | Admitting: Internal Medicine

## 2018-02-15 ENCOUNTER — Other Ambulatory Visit: Payer: Self-pay

## 2018-02-15 ENCOUNTER — Encounter: Payer: 59 | Admitting: Internal Medicine

## 2018-02-15 ENCOUNTER — Encounter: Payer: Self-pay | Admitting: Internal Medicine

## 2018-02-15 VITALS — BP 109/79 | HR 68 | Temp 99.1°F | Resp 16 | Ht 68.5 in | Wt 185.0 lb

## 2018-02-15 DIAGNOSIS — D122 Benign neoplasm of ascending colon: Secondary | ICD-10-CM | POA: Diagnosis not present

## 2018-02-15 DIAGNOSIS — Z800 Family history of malignant neoplasm of digestive organs: Secondary | ICD-10-CM | POA: Diagnosis not present

## 2018-02-15 DIAGNOSIS — D125 Benign neoplasm of sigmoid colon: Secondary | ICD-10-CM | POA: Diagnosis not present

## 2018-02-15 DIAGNOSIS — E039 Hypothyroidism, unspecified: Secondary | ICD-10-CM | POA: Diagnosis not present

## 2018-02-15 DIAGNOSIS — Z86010 Personal history of colonic polyps: Secondary | ICD-10-CM

## 2018-02-15 DIAGNOSIS — J45909 Unspecified asthma, uncomplicated: Secondary | ICD-10-CM | POA: Diagnosis not present

## 2018-02-15 MED ORDER — SODIUM CHLORIDE 0.9 % IV SOLN: 500.0000 mL | Freq: Once | INTRAVENOUS | Status: DC

## 2018-02-15 NOTE — Progress Notes (Signed)
Called to room to assist during endoscopic procedure.  Patient ID and intended procedure confirmed with present staff. Received instructions for my participation in the procedure from the performing physician.  

## 2018-02-15 NOTE — Progress Notes (Signed)
Pt's states no medical or surgical changes since previsit or office visit. 

## 2018-02-15 NOTE — Op Note (Signed)
[facility] Patient Name: Felicia Leonard Procedure Date: 02/15/2018 10:14 AM MRN: [id number] Endoscopist: Docia Chuck. Henrene Pastor , MD Age: 57 Referring MD:  Date of Birth: [birth year] Gender: Female Account #: [id number] Procedure:                Colonoscopy, With cold snare polypectomy x 2 Indications:              Screening in patient at increased risk: Colorectal                            cancer in mother before age 29. Multiple complete                            colonoscopies elsewhere ([place]) Last complete                            examination 2013 was negative.Flexible                            Sigmoidoscopy here 2015 which was negative Medicines:                Monitored Anesthesia Care Procedure:                Pre-Anesthesia Assessment:                           - Prior to the procedure, a History and Physical                            was performed, and patient medications and                            allergies were reviewed. The patient's tolerance of                            previous anesthesia was also reviewed. The risks                            and benefits of the procedure and the sedation                            options and risks were discussed with the patient.                            All questions were answered, and informed consent                            was obtained. Prior Anticoagulants: The patient has                            taken no previous anticoagulant or antiplatelet                            agents. After reviewing the risks and benefits, the  patient was deemed in satisfactory condition to                            undergo the procedure.                           After obtaining informed consent, the colonoscope                            was passed under direct vision. Throughout the                            procedure, the patient's blood pressure, pulse, and                            oxygen  saturations were monitored continuously. The                            Colonoscope was introduced through the anus and                            advanced to the the cecum, identified by                            appendiceal orifice and ileocecal valve. The                            ileocecal valve, appendiceal orifice, and rectum                            were photographed. The quality of the bowel                            preparation was excellent. The colonoscopy was                            performed without difficulty. The patient tolerated                            the procedure well. The bowel preparation used was                            SUPREP. Scope In: 10:21:44 AM Scope Out: 10:36:44 AM Scope Withdrawal Time: 0 hours 10 minutes 18 seconds  Total Procedure Duration: 0 hours 15 minutes 0 seconds  Findings:                 Two polyps were found in the sigmoid colon and                            ascending colon. The polyps were 2 to 4 mm in size.                            These polyps were removed with a cold snare.  Resection and retrieval were complete.                           Multiple diverticula were found in the sigmoid                            colon.                           The exam was otherwise without abnormality on                            direct and retroflexion views. Complications:            No immediate complications. Estimated blood loss:                            None. Estimated Blood Loss:     Estimated blood loss: none. Impression:               - Two 2 to 4 mm polyps in the sigmoid colon and in                            the ascending colon, removed with a cold snare.                            Resected and retrieved.                           - Diverticulosis in the sigmoid colon.                           - The examination was otherwise normal on direct                            and retroflexion  views. Recommendation:           - Repeat colonoscopy in 5 years for surveillance.                           - Patient has a contact number available for                            emergencies. The signs and symptoms of potential                            delayed complications were discussed with the                            patient. Return to normal activities tomorrow.                            Written discharge instructions were provided to the                            patient.                           -  Resume previous diet.                           - Continue present medications.                           - Await pathology results. Docia Chuck. Henrene Pastor, MD 02/15/2018 10:41:47 AM This report has been signed electronically.

## 2018-02-15 NOTE — Patient Instructions (Signed)
YOU HAD AN ENDOSCOPIC PROCEDURE TODAY AT [facility]:   Refer to the procedure report that was given to you for any specific questions about what was found during the examination.  If the procedure report does not answer your questions, please call your gastroenterologist to clarify.  If you requested that your care partner not be given the details of your procedure findings, then the procedure report has been included in a sealed envelope for you to review at your convenience later.  YOU SHOULD EXPECT: Some feelings of bloating in the abdomen. Passage of more gas than usual.  Walking can help get rid of the air that was put into your GI tract during the procedure and reduce the bloating. If you had a lower endoscopy (such as a colonoscopy or flexible sigmoidoscopy) you may notice spotting of blood in your stool or on the toilet paper. If you underwent a bowel prep for your procedure, you may not have a normal bowel movement for a few days.  Please Note:  You might notice some irritation and congestion in your nose or some drainage.  This is from the oxygen used during your procedure.  There is no need for concern and it should clear up in a day or so.  SYMPTOMS TO REPORT IMMEDIATELY:   Following lower endoscopy (colonoscopy or flexible sigmoidoscopy):  Excessive amounts of blood in the stool  Significant tenderness or worsening of abdominal pains  Swelling of the abdomen that is new, acute  Fever of 100F or higher   For urgent or emergent issues, a gastroenterologist can be reached at any hour by calling [phone].  Please see handouts given to you on Diverticulosis and Polyps.  DIET:  We do recommend a small meal at first, but then you may proceed to your regular diet.  Drink plenty of fluids but you should avoid alcoholic beverages for 24 hours.  ACTIVITY:  You should plan to take it easy for the rest of today and you should NOT DRIVE or use heavy machinery until  tomorrow (because of the sedation medicines used during the test).    FOLLOW UP: Our staff will call the number listed on your records the next business day following your procedure to check on you and address any questions or concerns that you may have regarding the information given to you following your procedure. If we do not reach you, we will leave a message.  However, if you are feeling well and you are not experiencing any problems, there is no need to return our call.  We will assume that you have returned to your regular daily activities without incident.  If any biopsies were taken you will be contacted by phone or by letter within the next 1-3 weeks.  Please call us at [phone] if you have not heard about the biopsies in 3 weeks.    SIGNATURES/CONFIDENTIALITY: You and/or your care partner have signed paperwork which will be entered into your electronic medical record.  These signatures attest to the fact that that the information above on your After Visit Summary has been reviewed and is understood.  Full responsibility of the confidentiality of this discharge information lies with you and/or your care-partner.  Thank you for letting us take care of your healthcare needs today.

## 2018-02-15 NOTE — Progress Notes (Signed)
To recovery, report to RN, VSS. 

## 2018-02-16 ENCOUNTER — Telehealth: Payer: Self-pay

## 2018-02-16 NOTE — Telephone Encounter (Signed)
  Follow up Call-  Call back number 02/15/2018  Post procedure Call Back phone  # [phone]  Permission to leave phone message Yes  Some recent data might be hidden     Patient questions:  Do you have a fever, pain , or abdominal swelling? No. Pain Score  0 *  Have you tolerated food without any problems? Yes.    Have you been able to return to your normal activities? Yes.    Do you have any questions about your discharge instructions: Diet   No. Medications  No. Follow up visit  No.  Do you have questions or concerns about your Care? No.  Actions: * If pain score is 4 or above: No action needed, pain <4.  No problems noted per pt. maw

## 2018-02-17 ENCOUNTER — Other Ambulatory Visit: Payer: Self-pay | Admitting: Otolaryngology

## 2018-02-17 ENCOUNTER — Telehealth: Payer: Self-pay | Admitting: Pharmacist

## 2018-02-17 MED FILL — BETAMETHASONE VALER 0.12% F: 0.12 | 14 days supply | Qty: 100 | Fill #0

## 2018-02-17 MED FILL — CLOBETASOL EMOLLNT 0.05% FO: 0.05 | 30 days supply | Qty: 100 | Fill #0

## 2018-02-17 MED FILL — DESONIDE 0.05% CREAM: 0.05 | 30 days supply | Qty: 60 | Fill #0

## 2018-02-17 NOTE — Telephone Encounter (Signed)
Called patient to discuss meeting with me in the [facility] concerning her [place]. She reports that she is not sure she wants to take the medication due to the cost to the insurance (she is aware that meeting with me would allow her to have no copay on the medication but that yes, the insurance still would have to pay a portion of the medication - it is not completely free from the drug company). Instructed her to discuss with her physician because we definitely want to help her get the medication if she needs to be on it and to not let the cost to the insurance prevent her from getting medications that she needs . Patient verbalized understanding and reported that she would call me back if she decided to take it.

## 2018-02-18 MED FILL — clonazePAM 1 MG TABS: 1 | 60 days supply | Qty: 90 | Fill #1

## 2018-02-19 ENCOUNTER — Encounter: Payer: Self-pay | Admitting: Internal Medicine

## 2018-03-04 MED FILL — LIOTHYRONINE SODIUM 5 MCG T: 5 | 30 days supply | Qty: 60 | Fill #2

## 2018-03-04 MED FILL — LEVOTHYROXINE 88 MCG TABLET: 88 | 30 days supply | Qty: 30 | Fill #6

## 2018-03-09 ENCOUNTER — Encounter (HOSPITAL_BASED_OUTPATIENT_CLINIC_OR_DEPARTMENT_OTHER): Payer: Self-pay | Admitting: *Deleted

## 2018-03-09 ENCOUNTER — Ambulatory Visit: Payer: 59 | Admitting: Certified Nurse Midwife

## 2018-03-09 ENCOUNTER — Other Ambulatory Visit: Payer: Self-pay

## 2018-03-09 ENCOUNTER — Encounter: Payer: Self-pay | Admitting: Certified Nurse Midwife

## 2018-03-09 VITALS — BP 130/86 | HR 78 | Resp 16 | Ht 68.25 in | Wt 188.0 lb

## 2018-03-09 DIAGNOSIS — N952 Postmenopausal atrophic vaginitis: Secondary | ICD-10-CM | POA: Diagnosis not present

## 2018-03-09 DIAGNOSIS — F17200 Nicotine dependence, unspecified, uncomplicated: Secondary | ICD-10-CM

## 2018-03-09 DIAGNOSIS — Z01419 Encounter for gynecological examination (general) (routine) without abnormal findings: Secondary | ICD-10-CM

## 2018-03-09 MED ORDER — ESTRADIOL 10 MCG VA TABS: 1.0000 | ORAL_TABLET | VAGINAL | 4 refills | Status: DC

## 2018-03-09 MED FILL — YUVAFEM 10 MCG VAGINAL INSE: 10 | 84 days supply | Qty: 24 | Fill #0

## 2018-03-09 NOTE — Progress Notes (Signed)
57 y.o. G52P2002 Married  Caucasian Fe here for annual exam.Menopausal no HRT. Denies vaginal bleeding. Has  increased vaginal dryness due to not using Vagifem. Has not been using Vagifem since insurance change. Needs new  Rx for Vagifem sent to new pharmacy. Sees PCP Dr. Ardeth Perfect for hypothyroid, insomnia, vitamin D, anxiety management, all medications stable per patient.Last visit 1/19 with labs. Recent loss of another dog, has adopted another puppy to help with loss. Plans on stopping smoking this year. Would like information on smoking cessation program with [facility]. No other health issues today.  Patient's last menstrual period was 11/24/2002.          Sexually active: Yes.    The current method of family planning is status post hysterectomy.    Exercising: Yes.    dancing Smoker:  yes  Health Maintenance: Pap:  01-24-16 neg History of Abnormal Pap: no MMG:  08-17-17 category c density birads 1:neg Self Breast exams: no Colonoscopy:  2019 f/u 58yrs BMD:   2009 normal TDaP:  2013 Shingles: not done Pneumonia: 2014 Hep C and HIV: Hep c neg 2016, HIV neg during pregnancy Labs: if needed   reports that she has been smoking cigarettes.  She has been smoking about 1.00 pack per day. She has never used smokeless tobacco. She reports that she does not drink alcohol or use drugs.  Past Medical History:  Diagnosis Date  . Allergy   . Anal fissure   . Anxiety   . Arthritis   . Asthma   . Bladder cancer ([place])   . Colon polyps    TUBULAR ADENOMA   . Depression   . Diverticulitis   . Diverticulosis   . Endometriosis   . Enlarged liver   . Fatty liver   . Fibroid   . Genital warts   . GERD (gastroesophageal reflux disease)   . Hemorrhoids   . Hepatomegaly   . Hiatal hernia   . Pneumonia   . Thyroid disease   . UTI (lower urinary tract infection)   . Varicose veins of both lower extremities     Past Surgical History:  Procedure Laterality Date  . BLADDER SURGERY    .  BRONCHOSCOPY    . CESAREAN SECTION    . SPINE SURGERY    . TONSILLECTOMY    . VAGINAL HYSTERECTOMY  2004    Current Outpatient Medications  Medication Sig Dispense Refill  . Betamethasone Valerate 0.12 % foam as needed.    . calcipotriene (DOVONOX) 0.005 % cream     . calcipotriene-betamethasone (TACLONEX) ointment as needed.    . cetirizine (ZYRTEC) 10 MG tablet Take 10 mg by mouth daily.    . Cholecalciferol (VITAMIN D-3 PO) Take 1 tablet by mouth daily.    . Clobetasol Propionate Emulsion 0.05 % topical foam     . clonazePAM (KLONOPIN) 1 MG tablet   0  . Cyanocobalamin (VITAMIN B-12 CR PO) Take 1 tablet by mouth daily.    . Estradiol (VAGIFEM) 10 MCG TABS vaginal tablet Place 1 tablet (10 mcg total) vaginally 2 (two) times a week. (Patient not taking: Reported on 02/01/2018) 18 tablet 12  . FLUOCINOLONE ACETONIDE SCALP 0.01 % OIL as needed.  0  . levothyroxine (SYNTHROID, LEVOTHROID) 88 MCG tablet Take 88 mcg by mouth daily before breakfast.    . liothyronine (CYTOMEL) 5 MCG tablet Take 10 mcg by mouth daily.  0  . OVER THE COUNTER MEDICATION Elderberry Juice, One tablespoon daily.    Marland Kitchen  PROAIR HFA 108 (90 Base) MCG/ACT inhaler USE 1 PUFF EVERY 6 HOURS AS NEEDED FOR DYSPNEA AND WHEEZING  0  . zolpidem (AMBIEN) 10 MG tablet as needed. Reported on 02/12/2016  0   Current Facility-Administered Medications  Medication Dose Route Frequency Provider Last Rate Last Dose  . 0.9 %  sodium chloride infusion  500 mL Intravenous Once Irene Shipper, MD        Family History  Problem Relation Age of Onset  . Breast cancer Maternal Grandmother   . Stroke Maternal Grandmother   . Dementia Maternal Grandmother   . Colon cancer Mother   . Cancer Mother        liver  . Liver cancer Mother   . Hypertension Father   . Thyroid disease Father   . Heart attack Maternal Grandfather   . Dementia Paternal Grandmother   . Liver cancer Paternal Uncle   . Esophageal cancer Neg Hx   . Pancreatic cancer  Neg Hx   . Rectal cancer Neg Hx   . Stomach cancer Neg Hx     ROS:  Pertinent items are noted in HPI.  Otherwise, a comprehensive ROS was negative.  Exam:   BP 130/86   Pulse 78   Resp 16   Ht 5' 8.25" (1.734 m)   Wt 188 lb (85.3 kg)   LMP 11/24/2002   BMI 28.38 kg/m  Height: 5' 8.25" (173.4 cm) Ht Readings from Last 3 Encounters:  03/09/18 5' 8.25" (1.734 m)  02/15/18 5' 8.5" (1.74 m)  02/01/18 5' 8.5" (1.74 m)    General appearance: alert, cooperative and appears stated age Head: Normocephalic, without obvious abnormality, atraumatic Neck: no adenopathy, supple, symmetrical, trachea midline and thyroid normal to inspection and palpation Lungs: clear to auscultation bilaterally Breasts: normal appearance, no masses or tenderness, No nipple retraction or dimpling, No nipple discharge or bleeding, No axillary or supraclavicular adenopathy Heart: regular rate and rhythm Abdomen: soft, non-tender; no masses,  no organomegaly Extremities: extremities normal, atraumatic, no cyanosis or edema Skin: Skin color, texture, turgor normal. No rashes or lesions Lymph nodes: Cervical, supraclavicular, and axillary nodes normal. No abnormal inguinal nodes palpated Neurologic: Grossly normal   Pelvic: External genitalia:  no lesions, normal female              Urethra:  normal appearing urethra with no masses, tenderness or lesions              Bartholin's and Skene's: normal                 Vagina:atrophic appearing vagina with normal color and scant discharge, no lesions              Cervix: absent              Pap taken: No. Bimanual Exam:  Uterus:  uterus absent              Adnexa: normal adnexa and no mass, fullness, tenderness               Rectovaginal: Confirms               Anus:  normal sphincter tone, no lesions  Chaperone present: yes  A:  Well Woman with normal exam  Menopausal no HRT s/p TVH  Due endometritosis  Atrophic vaginitis requests Vagifem again  Smoking  cessation information desired  Hypothyroid/Anxiety, Vitamin D management with PCP  P:   Reviewed health and wellness pertinent to exam  Aware if vaginal bleeding needs to advise  Discussed risks/benefits/warning signs of Vagifem, desires continuance  Rx Vagifem see order with instructions  Continue follow up with PCP as indicated  Pap smear: no   counseled on breast self exam, mammography screening, feminine hygiene, adequate intake of calcium and vitamin D, diet and exercise  return annually or prn  An After Visit Summary was printed and given to the patient.

## 2018-03-09 NOTE — Patient Instructions (Signed)

## 2018-03-10 ENCOUNTER — Other Ambulatory Visit: Payer: Self-pay

## 2018-03-10 ENCOUNTER — Encounter (HOSPITAL_BASED_OUTPATIENT_CLINIC_OR_DEPARTMENT_OTHER): Payer: Self-pay | Admitting: *Deleted

## 2018-03-11 MED FILL — TACLONEX SCALP SUSPENSION: 0.005-0.064 | 30 days supply | Qty: 60 | Fill #0

## 2018-03-11 MED FILL — CALCIPOTRIENE 0.005% CREAM: 0.005 | 14 days supply | Qty: 60 | Fill #0

## 2018-03-26 ENCOUNTER — Ambulatory Visit (HOSPITAL_BASED_OUTPATIENT_CLINIC_OR_DEPARTMENT_OTHER): Payer: 59 | Admitting: Certified Registered Nurse Anesthetist

## 2018-03-26 ENCOUNTER — Ambulatory Visit (HOSPITAL_BASED_OUTPATIENT_CLINIC_OR_DEPARTMENT_OTHER)
Admission: RE | Admit: 2018-03-26 | Discharge: 2018-03-26 | Disposition: A | Discharge status: Home / Self Care | Payer: 59 | Source: Ambulatory Visit | Attending: Otolaryngology | Admitting: Otolaryngology

## 2018-03-26 ENCOUNTER — Encounter (HOSPITAL_BASED_OUTPATIENT_CLINIC_OR_DEPARTMENT_OTHER): Payer: Self-pay | Admitting: Anesthesiology

## 2018-03-26 ENCOUNTER — Encounter (HOSPITAL_BASED_OUTPATIENT_CLINIC_OR_DEPARTMENT_OTHER)
Admission: RE | Disposition: A | Discharge status: Home / Self Care | Payer: Self-pay | Source: Ambulatory Visit | Attending: Otolaryngology

## 2018-03-26 DIAGNOSIS — Z882 Allergy status to sulfonamides status: Secondary | ICD-10-CM | POA: Diagnosis not present

## 2018-03-26 DIAGNOSIS — K219 Gastro-esophageal reflux disease without esophagitis: Secondary | ICD-10-CM | POA: Insufficient documentation

## 2018-03-26 DIAGNOSIS — Z86010 Personal history of colonic polyps: Secondary | ICD-10-CM | POA: Diagnosis not present

## 2018-03-26 DIAGNOSIS — F419 Anxiety disorder, unspecified: Secondary | ICD-10-CM | POA: Diagnosis not present

## 2018-03-26 DIAGNOSIS — Z880 Allergy status to penicillin: Secondary | ICD-10-CM | POA: Insufficient documentation

## 2018-03-26 DIAGNOSIS — M1990 Unspecified osteoarthritis, unspecified site: Secondary | ICD-10-CM | POA: Diagnosis not present

## 2018-03-26 DIAGNOSIS — J069 Acute upper respiratory infection, unspecified: Secondary | ICD-10-CM | POA: Diagnosis not present

## 2018-03-26 DIAGNOSIS — Z8551 Personal history of malignant neoplasm of bladder: Secondary | ICD-10-CM | POA: Diagnosis not present

## 2018-03-26 DIAGNOSIS — R070 Pain in throat: Secondary | ICD-10-CM | POA: Diagnosis not present

## 2018-03-26 DIAGNOSIS — Z8249 Family history of ischemic heart disease and other diseases of the circulatory system: Secondary | ICD-10-CM | POA: Diagnosis not present

## 2018-03-26 DIAGNOSIS — J383 Other diseases of vocal cords: Secondary | ICD-10-CM | POA: Diagnosis not present

## 2018-03-26 DIAGNOSIS — Z881 Allergy status to other antibiotic agents status: Secondary | ICD-10-CM | POA: Insufficient documentation

## 2018-03-26 DIAGNOSIS — Z888 Allergy status to other drugs, medicaments and biological substances status: Secondary | ICD-10-CM | POA: Insufficient documentation

## 2018-03-26 DIAGNOSIS — Z79899 Other long term (current) drug therapy: Secondary | ICD-10-CM | POA: Insufficient documentation

## 2018-03-26 DIAGNOSIS — F329 Major depressive disorder, single episode, unspecified: Secondary | ICD-10-CM | POA: Diagnosis not present

## 2018-03-26 DIAGNOSIS — Z800 Family history of malignant neoplasm of digestive organs: Secondary | ICD-10-CM | POA: Diagnosis not present

## 2018-03-26 DIAGNOSIS — K760 Fatty (change of) liver, not elsewhere classified: Secondary | ICD-10-CM | POA: Diagnosis not present

## 2018-03-26 DIAGNOSIS — Z8719 Personal history of other diseases of the digestive system: Secondary | ICD-10-CM | POA: Diagnosis not present

## 2018-03-26 DIAGNOSIS — J45909 Unspecified asthma, uncomplicated: Secondary | ICD-10-CM | POA: Insufficient documentation

## 2018-03-26 DIAGNOSIS — F17210 Nicotine dependence, cigarettes, uncomplicated: Secondary | ICD-10-CM | POA: Diagnosis not present

## 2018-03-26 DIAGNOSIS — J343 Hypertrophy of nasal turbinates: Secondary | ICD-10-CM | POA: Diagnosis not present

## 2018-03-26 HISTORY — PX: MICROLARYNGOSCOPY: SHX5208

## 2018-03-26 SURGERY — MICROLARYNGOSCOPY
Anesthesia: General

## 2018-03-26 MED ORDER — ALBUTEROL SULFATE HFA 108 (90 BASE) MCG/ACT IN AERS
INHALATION_SPRAY | RESPIRATORY_TRACT | Status: DC | PRN
  Administered 2018-03-26: 4 via RESPIRATORY_TRACT

## 2018-03-26 MED ORDER — DEXAMETHASONE SODIUM PHOSPHATE 10 MG/ML IJ SOLN
INTRAMUSCULAR | Status: AC
Start: 2018-03-26 — End: ?
  Filled 2018-03-26: qty 1

## 2018-03-26 MED ORDER — LACTATED RINGERS IV SOLN
INTRAVENOUS | Status: DC
  Administered 2018-03-26: 10:00:00 via INTRAVENOUS

## 2018-03-26 MED ORDER — OXYCODONE HCL 5 MG/5ML PO SOLN: 5.0000 mg | Freq: Once | ORAL | Status: DC | PRN

## 2018-03-26 MED ORDER — EPINEPHRINE PF 1 MG/ML IJ SOLN
INTRAMUSCULAR | Status: DC | PRN
  Administered 2018-03-26: 1 mg

## 2018-03-26 MED ORDER — EPINEPHRINE PF 1 MG/10ML IJ SOSY
PREFILLED_SYRINGE | INTRAMUSCULAR | Status: DC | PRN
  Administered 2018-03-26: .05 mg via INTRAVENOUS

## 2018-03-26 MED ORDER — SCOPOLAMINE 1 MG/3DAYS TD PT72: 1.0000 | MEDICATED_PATCH | Freq: Once | TRANSDERMAL | Status: DC | PRN

## 2018-03-26 MED ORDER — HYDROCODONE-ACETAMINOPHEN 5-325 MG PO TABS: 1.0000 | ORAL_TABLET | Freq: Four times a day (QID) | ORAL | 0 refills | Status: DC | PRN

## 2018-03-26 MED ORDER — FENTANYL CITRATE (PF) 100 MCG/2ML IJ SOLN
25.0000 ug | INTRAMUSCULAR | Status: DC | PRN
  Administered 2018-03-26: 50 ug via INTRAVENOUS

## 2018-03-26 MED ORDER — DEXAMETHASONE SODIUM PHOSPHATE 4 MG/ML IJ SOLN
INTRAMUSCULAR | Status: DC | PRN
  Administered 2018-03-26: 10 mg via INTRAVENOUS

## 2018-03-26 MED ORDER — MIDAZOLAM HCL 2 MG/2ML IJ SOLN
1.0000 mg | INTRAMUSCULAR | Status: DC | PRN
  Administered 2018-03-26: 2 mg via INTRAVENOUS

## 2018-03-26 MED ORDER — FENTANYL CITRATE (PF) 100 MCG/2ML IJ SOLN
INTRAMUSCULAR | Status: AC
  Filled 2018-03-26: qty 2

## 2018-03-26 MED ORDER — SUCCINYLCHOLINE CHLORIDE 200 MG/10ML IV SOSY
PREFILLED_SYRINGE | INTRAVENOUS | Status: DC | PRN
  Administered 2018-03-26: 120 mg via INTRAVENOUS

## 2018-03-26 MED ORDER — ONDANSETRON HCL 4 MG PO TABS: 4.0000 mg | ORAL_TABLET | Freq: Three times a day (TID) | ORAL | 1 refills | Status: AC | PRN

## 2018-03-26 MED ORDER — PROPOFOL 10 MG/ML IV BOLUS
INTRAVENOUS | Status: AC
  Filled 2018-03-26: qty 20

## 2018-03-26 MED ORDER — ONDANSETRON HCL 4 MG/2ML IJ SOLN: 4.0000 mg | Freq: Once | INTRAMUSCULAR | Status: DC | PRN

## 2018-03-26 MED ORDER — METHYLENE BLUE 0.5 % INJ SOLN
INTRAVENOUS | Status: AC
  Filled 2018-03-26: qty 10

## 2018-03-26 MED ORDER — OXYCODONE HCL 5 MG PO TABS: 5.0000 mg | ORAL_TABLET | Freq: Once | ORAL | Status: DC | PRN

## 2018-03-26 MED ORDER — ROCURONIUM BROMIDE 10 MG/ML (PF) SYRINGE
PREFILLED_SYRINGE | INTRAVENOUS | Status: DC | PRN
  Administered 2018-03-26: 40 mg via INTRAVENOUS

## 2018-03-26 MED ORDER — MIDAZOLAM HCL 2 MG/2ML IJ SOLN
INTRAMUSCULAR | Status: AC
  Filled 2018-03-26: qty 2

## 2018-03-26 MED ORDER — PROPOFOL 10 MG/ML IV BOLUS
INTRAVENOUS | Status: DC | PRN
  Administered 2018-03-26: 50 mg via INTRAVENOUS
  Administered 2018-03-26: 200 mg via INTRAVENOUS

## 2018-03-26 MED ORDER — ROCURONIUM BROMIDE 10 MG/ML (PF) SYRINGE
PREFILLED_SYRINGE | INTRAVENOUS | Status: AC
  Filled 2018-03-26: qty 5

## 2018-03-26 MED ORDER — ONDANSETRON HCL 4 MG/2ML IJ SOLN
INTRAMUSCULAR | Status: AC
  Filled 2018-03-26: qty 2

## 2018-03-26 MED ORDER — ONDANSETRON HCL 4 MG/2ML IJ SOLN
INTRAMUSCULAR | Status: DC | PRN
  Administered 2018-03-26: 4 mg via INTRAVENOUS

## 2018-03-26 MED ORDER — CLONAZEPAM 1 MG PO TABS
1.0000 mg | ORAL_TABLET | Freq: Once | ORAL | Status: AC
  Administered 2018-03-26: 1 mg via ORAL
  Filled 2018-03-26: qty 1

## 2018-03-26 MED ORDER — FENTANYL CITRATE (PF) 100 MCG/2ML IJ SOLN
50.0000 ug | INTRAMUSCULAR | Status: DC | PRN
  Administered 2018-03-26: 100 ug via INTRAVENOUS

## 2018-03-26 MED FILL — ONDANSETRON HCL 4 MG TABLET: 4 | 7 days supply | Qty: 20 | Fill #0

## 2018-03-26 MED FILL — HYDROCODON-APAP 5-325: 5-325 | 3 days supply | Qty: 20 | Fill #0

## 2018-03-26 SURGICAL SUPPLY — 27 items
CANISTER SUCT 1200ML W/VALVE (MISCELLANEOUS) ×2 IMPLANT
CONT SPEC 4OZ CLIKSEAL STRL BL (MISCELLANEOUS) ×4 IMPLANT
FILTER 7/8 IN (FILTER) IMPLANT
GAUZE SPONGE 4X4 12PLY STRL LF (GAUZE/BANDAGES/DRESSINGS) ×4 IMPLANT
GLOVE BIO SURGEON STRL SZ 6.5 (GLOVE) ×2 IMPLANT
GLOVE BIO SURGEON STRL SZ7.5 (GLOVE) ×2 IMPLANT
GLOVE BIOGEL PI IND STRL 7.0 (GLOVE) ×1 IMPLANT
GLOVE BIOGEL PI INDICATOR 7.0 (GLOVE) ×1
GOWN STRL REUS W/ TWL LRG LVL3 (GOWN DISPOSABLE) ×1 IMPLANT
GOWN STRL REUS W/TWL LRG LVL3 (GOWN DISPOSABLE) ×1
GUARD TEETH (MISCELLANEOUS) ×2 IMPLANT
KIT LARYNGOTRACHEAL ANES LTA (SET/KITS/TRAYS/PACK) IMPLANT
NEEDLE HYPO 18GX1.5 BLUNT FILL (NEEDLE) IMPLANT
NEEDLE SPNL 22GX7 QUINCKE BK (NEEDLE) IMPLANT
NS IRRIG 1000ML POUR BTL (IV SOLUTION) ×2 IMPLANT
PACK BASIN DAY SURGERY FS (CUSTOM PROCEDURE TRAY) ×2 IMPLANT
PATTIES SURGICAL .5 X3 (DISPOSABLE) ×2 IMPLANT
REDUCTION FITTING 1/4 IN (FILTER) ×2 IMPLANT
SHEET MEDIUM DRAPE 40X70 STRL (DRAPES) ×2 IMPLANT
SLEEVE SCD COMPRESS KNEE MED (MISCELLANEOUS) ×2 IMPLANT
SOLUTION ANTI FOG 6CC (MISCELLANEOUS) ×2 IMPLANT
SOLUTION BUTLER CLEAR DIP (MISCELLANEOUS) IMPLANT
SURGILUBE 2OZ TUBE FLIPTOP (MISCELLANEOUS) IMPLANT
SYR 5ML LL (SYRINGE) ×2 IMPLANT
SYR CONTROL 10ML LL (SYRINGE) IMPLANT
TOWEL OR 17X24 6PK STRL BLUE (TOWEL DISPOSABLE) ×2 IMPLANT
TUBE CONNECTING 20X1/4 (TUBING) ×4 IMPLANT

## 2018-03-26 NOTE — Discharge Instructions (Signed)

## 2018-03-26 NOTE — Progress Notes (Signed)
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 Redmond Baseman under [place]. Patient was induced uneventfully and a 6.0 metal  laser endotracheal tube was inserted without difficulty. Ventilation was adequate after methylene blue colored saline was inserted into both ETT cuffs. After the OR table was turned and Dr. Redmond Baseman started the procedure, ventilation became extremely difficult and the O2 Sat dropped to 70-75%. I was called to the room and I could not hear BS and there was no ET CO2 detected despite Dr. Redmond Baseman confiriming that the tube was in the trachea. Bronchospasm was initially suspected and the patient was given 50 mcg of epinephrine and ETT albuterol without improvement in ventilation. The decision was then made to replace the laser tube with a conventional 7.5 ETT which was done by Dr. Redmond Baseman. The ventilation was immediately restored and the O2 Sat improved to 98%. During the intubation the HR dropped to 30 bpm and the O2 Sat dropped to <40%.   Upon inspection of the endotracheal tube, it was noted that the distal cuff was partiallly herniated. I suspect that this resulted in ETT obstructuion which was the reason for the difficulty in ventilation.  The patient emerged from anesthesia uneventfully and was alert and oriented  in the PACU with stable respiratory and hemodynamic parameters.  Felicia Leonard

## 2018-03-26 NOTE — Anesthesia Procedure Notes (Signed)
Procedure Name: Intubation Date/Time: 03/26/2018 12:19 PM Performed by: Lyndee Leo, CRNA Pre-anesthesia Checklist: Patient identified, Emergency Drugs available, Suction available and Patient being monitored Patient Re-evaluated:Patient Re-evaluated prior to induction Oxygen Delivery Method: Circle system utilized Preoxygenation: Pre-oxygenation with 100% oxygen Induction Type: IV induction Ventilation: Mask ventilation without difficulty Laryngoscope Size: Mac and 3 Grade View: Grade I Tube type: MLT Number of attempts: 1 Airway Equipment and Method: Stylet and Oral airway Placement Confirmation: ETT inserted through vocal cords under direct vision,  positive ETCO2 and breath sounds checked- equal and bilateral Tube secured with: Tape Dental Injury: Teeth and Oropharynx as per pre-operative assessment

## 2018-03-26 NOTE — Brief Op Note (Signed)
03/26/2018  12:58 PM  PATIENT:  Felicia Leonard  57 y.o. female  PRE-OPERATIVE DIAGNOSIS:  LESION ON VOCAL CORD  POST-OPERATIVE DIAGNOSIS:  same  PROCEDURE:  Procedure(s): MICROLARYNGOSCOPY WITH CO2 LASER AND EXCISION OF VOCAL CORD LESION (N/A) No laser used  SURGEON:  Surgeon(s) and Role:    Melida Quitter, MD - Primary  PHYSICIAN ASSISTANT:   ASSISTANTS: none   ANESTHESIA:   general  EBL:  Minimal   BLOOD ADMINISTERED:none  DRAINS: none   LOCAL MEDICATIONS USED:  NONE  SPECIMEN:  Source of Specimen:  right vocal fold  DISPOSITION OF SPECIMEN:  PATHOLOGY  COUNTS:  YES  TOURNIQUET:  * No tourniquets in log *  DICTATION: .Other Dictation: Dictation Number [id number]  PLAN OF CARE: Discharge to home after PACU  PATIENT DISPOSITION:  PACU - hemodynamically stable.   Delay start of Pharmacological VTE agent (>24hrs) due to surgical blood loss or risk of bleeding: no

## 2018-03-26 NOTE — Transfer of Care (Signed)
Immediate Anesthesia Transfer of Care Note  Patient: Felicia Leonard  Procedure(s) Performed: MICROLARYNGOSCOPY WITH VOCAL CORD BIOPSY (N/A )  Patient Location: PACU  Anesthesia Type:General  Level of Consciousness: awake, sedated and patient cooperative  Airway & Oxygen Therapy: Patient Spontanous Breathing and Patient connected to face mask oxygen  Post-op Assessment: Report given to RN and Post -op Vital signs reviewed and stable  Post vital signs: Reviewed and stable  Last Vitals:  Vitals Value Taken Time  BP 133/90 03/26/2018  1:15 PM  Temp    Pulse 89 03/26/2018  1:16 PM  Resp 12 03/26/2018  1:16 PM  SpO2 99 % 03/26/2018  1:16 PM  Vitals shown include unvalidated device data.  Last Pain:  Vitals:   03/26/18 0926  TempSrc: Oral  PainSc: 5          Complications: No apparent anesthesia complications

## 2018-03-26 NOTE — Op Note (Signed)
NAMELESETTE, FRARY MEDICAL RECORD [id number] ACCOUNT [id number] DATE OF BIRTH:05-15-1961 FACILITY: [facility] LOCATION: [facility] PHYSICIAN:Zanovia Rotz D. Tabb Croghan, MD  OPERATIVE REPORT  DATE OF PROCEDURE:  03/26/2018  PREOPERATIVE DIAGNOSIS:  Throat pain and right vocal fold lesion.  POSTOPERATIVE DIAGNOSIS:  Throat pain and right vocal fold lesion.  PROCEDURE:  Suspended microdirect laryngoscopy with biopsy of right vocal fold.  SURGEON:  Melida Quitter, MD.  ANESTHESIA:  General endotracheal anesthesia.  COMPLICATIONS:  None.  INDICATIONS:  The patient is a 57 year old female who is a smoker and has a history of throat pain.  On laryngoscopy in the office, she was found to have a whitish lesion of the right vocal fold and presented to the operating room for biopsy.  FINDINGS:  The superior surface of the right vocal fold had a whitish appearance without granulation or mass effect.  The majority of this lesion was removed with cup forceps in a piecemeal fashion.  The remainder of the larynx appeared healthy and  normal.  DESCRIPTION OF PROCEDURE:  The patient was identified in the holding room, and informed consent having been obtained, including discussion of risks, benefits, alternatives, the patient was brought to the operative suite and put on the operating table in  the supine position.  Anesthesia was induced and the patient was intubated by the anesthesia team with a laser safe tube without difficulty.  The patient was given intravenous steroids during the case.  The eyes were taped closed and the bed was turned  90 degrees from anesthesia.  Tooth guard was placed over the upper teeth and a Dedo laryngoscope was inserted to view the larynx.  At about this time, she began to drop her oxygen saturation and the anesthesia team began to troubleshoot.  The  endotracheal tube was seen to be through the vocal folds.  After trying various maneuvers by anesthesiology, the laser safe tube was  removed while I had the larynx exposed and an 8.0 standard endotracheal tube was placed and the patient was successfully  ventilated immediately.  Her oxygen saturation returned to normal immediately.  After she was fully stabilized, the Dedo laryngoscope was reinserted and placed into the supraglottic position.  It was suspended to the Mayo stand using a Lewy arm.  The  endotracheal tube was then backed out of the larynx and the vocal folds examined with a 0 degree telescope.  A preoperative photograph was made.  A 6.0 endotracheal tube was placed through the laryngoscope and through the larynx to give the patient  ventilation for a minute or 2 and then was removed again.  An epinephrine soaked pledget was held against the right vocal fold and then removed after a minute or so.  The endotracheal tube was replaced.  The tube was brought out yet again and under the  operating microscope, the right vocal fold superior surface was biopsied using a cup forceps removing the superior surface mucosa.  This was passed to nursing for pathology.  The endotracheal tube was again placed.  After removing it again, the larynx  was again examined with a 0 degree telescope and no other lesions were seen.  The endotracheal tube was replaced and held using cup forceps while the laryngoscope was taken out of suspension and removed from the patient's mouth over the tube.  The  patient was then ventilated successfully through the tube and the patient was turned back to anesthesia for wakeup.  She was extubated and moved to the recovery room in stable condition.  AN/NUANCE  D:03/26/2018 T:03/26/2018 JOB:[id number]

## 2018-03-26 NOTE — H&P (Signed)
Felicia Leonard is an 57 y.o. female.   Chief Complaint: Vocal fold lesion HPI: 57 year old female with ongoing sore throat found to be due to a right vocal lesion. She presents for excisional biopsy.  Past Medical History:  Diagnosis Date  . Allergy   . Anal fissure   . Anxiety   . Arthritis   . Asthma   . Bladder cancer ([place])   . Colon polyps    TUBULAR ADENOMA   . Depression   . Diverticulitis   . Diverticulosis   . Endometriosis   . Enlarged liver   . Fatty liver   . Fibroid   . Genital warts   . GERD (gastroesophageal reflux disease)   . Hemorrhoids   . Hepatomegaly   . Hiatal hernia   . Pneumonia   . Thyroid disease   . UTI (lower urinary tract infection)   . Varicose veins of both lower extremities     Past Surgical History:  Procedure Laterality Date  . BLADDER SURGERY    . BRONCHOSCOPY    . CESAREAN SECTION    . SPINE SURGERY    . TONSILLECTOMY    . VAGINAL HYSTERECTOMY  2004    Family History  Problem Relation Age of Onset  . Breast cancer Maternal Grandmother   . Stroke Maternal Grandmother   . Dementia Maternal Grandmother   . Colon cancer Mother   . Cancer Mother        liver  . Liver cancer Mother   . Hypertension Father   . Thyroid disease Father   . Heart attack Maternal Grandfather   . Dementia Paternal Grandmother   . Liver cancer Paternal Uncle   . Esophageal cancer Neg Hx   . Pancreatic cancer Neg Hx   . Rectal cancer Neg Hx   . Stomach cancer Neg Hx    Social History:  reports that she has been smoking cigarettes.  She has been smoking about 1.00 pack per day. She has never used smokeless tobacco. She reports that she does not drink alcohol or use drugs.  Allergies:  Allergies  Allergen Reactions  . Chantix [Varenicline] Hives    Rash on breast Rash on breast  . Erythromycin Base Other (See Comments)    UNKNOWN  . Penicillins Hives  . Sulfa Antibiotics Hives  . Sulfasalazine Hives  . Prilosec [Omeprazole] Rash     Medications Prior to Admission  Medication Sig Dispense Refill  . Betamethasone Valerate 0.12 % foam as needed.    . calcipotriene (DOVONOX) 0.005 % cream     . calcipotriene-betamethasone (TACLONEX) ointment as needed.    . cetirizine (ZYRTEC) 10 MG tablet Take 10 mg by mouth daily.    . Cholecalciferol (VITAMIN D-3 PO) Take 1 tablet by mouth daily.    . Clobetasol Propionate Emulsion 0.05 % topical foam     . clonazePAM (KLONOPIN) 1 MG tablet   0  . Cyanocobalamin (VITAMIN B-12 CR PO) Take 1 tablet by mouth daily.    . Estradiol (VAGIFEM) 10 MCG TABS vaginal tablet Place 1 tablet (10 mcg total) vaginally 2 (two) times a week. 24 tablet 4  . levothyroxine (SYNTHROID, LEVOTHROID) 88 MCG tablet Take 88 mcg by mouth daily before breakfast.    . liothyronine (CYTOMEL) 5 MCG tablet Take 10 mcg by mouth daily.  0  . methocarbamol (ROBAXIN) 500 MG tablet TK 1 T PO BID PRF MUSCLE PAIN    . Naphazoline-Pheniramine (OPCON-A OP) Apply to eye daily.    Marland Kitchen  zolpidem (AMBIEN) 10 MG tablet as needed. Reported on 02/12/2016  0    No results found for this or any previous visit (from the past 48 hour(s)). No results found.  Review of Systems  HENT: Positive for sore throat.   Respiratory: Positive for cough.   All other systems reviewed and are negative.   Blood pressure (!) 137/105, pulse 73, temperature 97.6 F (36.4 C), temperature source Oral, height 5' 8.5" (1.74 m), weight 188 lb (85.3 kg), last menstrual period 11/24/2002, SpO2 99 %. Physical Exam  Constitutional: She is oriented to person, place, and time. She appears well-developed and well-nourished. No distress.  HENT:  Head: Normocephalic and atraumatic.  Right Ear: External ear normal.  Left Ear: External ear normal.  Nose: Nose normal.  Mouth/Throat: Oropharynx is clear and moist.  Eyes: Pupils are equal, round, and reactive to light. Conjunctivae and EOM are normal.  Neck: Normal range of motion. Neck supple.  Cardiovascular:  Normal rate.  Respiratory: Effort normal.  Neurological: She is alert and oriented to person, place, and time. No cranial nerve deficit.  Skin: Skin is warm and dry.  Psychiatric: She has a normal mood and affect. Her behavior is normal. Judgment and thought content normal.     Assessment/Plan Sore throat and right vocal fold lesion To OR for SMDL with CO2 laser excision.  Melida Quitter, MD 03/26/2018, 12:02 PM

## 2018-03-26 NOTE — Anesthesia Preprocedure Evaluation (Signed)
Anesthesia Evaluation  Patient identified by MRN, date of birth, ID band Patient awake    Reviewed: Allergy & Precautions, NPO status , Patient's Chart, lab work & pertinent test results  Airway Mallampati: II  TM Distance: >3 FB Neck ROM: Full    Dental  (+) Teeth Intact, Dental Advisory Given   Pulmonary Current Smoker,    breath sounds clear to auscultation       Cardiovascular  Rhythm:Regular Rate:Normal     Neuro/Psych    GI/Hepatic   Endo/Other    Renal/GU      Musculoskeletal   Abdominal   Peds  Hematology   Anesthesia Other Findings   Reproductive/Obstetrics                             Anesthesia Physical Anesthesia Plan  ASA: II  Anesthesia Plan: General   Post-op Pain Management:    Induction:   PONV Risk Score and Plan: 1 and Ondansetron and Dexamethasone  Airway Management Planned: Oral ETT  Additional Equipment:   Intra-op Plan:   Post-operative Plan: Extubation in OR  Informed Consent: I have reviewed the patients History and Physical, chart, labs and discussed the procedure including the risks, benefits and alternatives for the proposed anesthesia with the patient or authorized representative who has indicated his/her understanding and acceptance.   Dental advisory given  Plan Discussed with: CRNA and Anesthesiologist  Anesthesia Plan Comments:         Anesthesia Quick Evaluation

## 2018-03-27 NOTE — Anesthesia Postprocedure Evaluation (Signed)
Anesthesia Post Note  Patient: Felicia Leonard  Procedure(s) Performed: MICROLARYNGOSCOPY WITH VOCAL CORD BIOPSY (N/A )     Patient location during evaluation: PACU Anesthesia Type: General Level of consciousness: awake and alert Pain management: pain level controlled Vital Signs Assessment: post-procedure vital signs reviewed and stable Respiratory status: spontaneous breathing, nonlabored ventilation, respiratory function stable and patient connected to nasal cannula oxygen Cardiovascular status: blood pressure returned to baseline and stable Postop Assessment: no apparent nausea or vomiting Anesthetic complications: no    Last Vitals:  Vitals:   03/26/18 1345 03/26/18 1409  BP: 119/89 128/85  Pulse: 78 80  Resp: 19 16  Temp:  36.8 C  SpO2: 96% 93%    Last Pain:  Vitals:   03/26/18 1409  TempSrc:   PainSc: 3                  Tydus Sanmiguel COKER

## 2018-03-29 ENCOUNTER — Encounter (HOSPITAL_BASED_OUTPATIENT_CLINIC_OR_DEPARTMENT_OTHER): Payer: Self-pay | Admitting: Otolaryngology

## 2018-04-02 MED FILL — LIOTHYRONINE SODIUM 5 MCG T: 5 | 30 days supply | Qty: 60 | Fill #3

## 2018-04-02 MED FILL — LEVOTHYROXINE 88 MCG TABLET: 88 | 30 days supply | Qty: 30 | Fill #7

## 2018-04-05 ENCOUNTER — Other Ambulatory Visit: Payer: Self-pay | Admitting: Internal Medicine

## 2018-04-05 DIAGNOSIS — I712 Thoracic aortic aneurysm, without rupture, unspecified: Secondary | ICD-10-CM

## 2018-04-13 DIAGNOSIS — J383 Other diseases of vocal cords: Secondary | ICD-10-CM | POA: Diagnosis not present

## 2018-04-13 DIAGNOSIS — R070 Pain in throat: Secondary | ICD-10-CM | POA: Diagnosis not present

## 2018-04-22 ENCOUNTER — Ambulatory Visit
Admission: RE | Admit: 2018-04-22 | Discharge: 2018-04-22 | Disposition: A | Discharge status: Home / Self Care | Payer: 59 | Source: Ambulatory Visit | Attending: Internal Medicine | Admitting: Internal Medicine

## 2018-04-22 DIAGNOSIS — I712 Thoracic aortic aneurysm, without rupture, unspecified: Secondary | ICD-10-CM

## 2018-04-22 MED ORDER — IOPAMIDOL (ISOVUE-370) INJECTION 76%
75.0000 mL | Freq: Once | INTRAVENOUS | Status: AC | PRN
  Administered 2018-04-22: 75 mL via INTRAVENOUS

## 2018-04-23 ENCOUNTER — Other Ambulatory Visit: Payer: 59

## 2018-04-29 MED FILL — LEVOTHYROXINE 88 MCG TABLET: 88 | 30 days supply | Qty: 30 | Fill #8

## 2018-04-29 MED FILL — LIOTHYRONINE SODIUM 5 MCG T: 5 | 30 days supply | Qty: 60 | Fill #4

## 2018-05-17 DIAGNOSIS — R042 Hemoptysis: Secondary | ICD-10-CM | POA: Diagnosis not present

## 2018-05-17 DIAGNOSIS — J449 Chronic obstructive pulmonary disease, unspecified: Secondary | ICD-10-CM | POA: Diagnosis not present

## 2018-05-17 DIAGNOSIS — J209 Acute bronchitis, unspecified: Secondary | ICD-10-CM | POA: Diagnosis not present

## 2018-05-17 DIAGNOSIS — F17200 Nicotine dependence, unspecified, uncomplicated: Secondary | ICD-10-CM | POA: Diagnosis not present

## 2018-05-17 DIAGNOSIS — Z6827 Body mass index (BMI) 27.0-27.9, adult: Secondary | ICD-10-CM | POA: Diagnosis not present

## 2018-05-17 MED FILL — HYDROCODONE-HOMATROPINE SOL: 5-1.5 | 6 days supply | Qty: 120 | Fill #0

## 2018-05-17 MED FILL — FLUTICASONE PROP 50 MCG SPR: 50 | 30 days supply | Qty: 16 | Fill #0

## 2018-05-17 MED FILL — predniSONE 20 MG TABS: 20 | 5 days supply | Qty: 10 | Fill #0

## 2018-05-17 MED FILL — DOXYCYCLINE HYCLATE 100 MG: 100 | 7 days supply | Qty: 14 | Fill #0

## 2018-05-24 MED FILL — LEVOTHYROXINE 88 MCG TABLET: 88 | 30 days supply | Qty: 30 | Fill #9

## 2018-05-24 MED FILL — ESTRADIOL 10 MCG TABS: 10 | 84 days supply | Qty: 24 | Fill #1

## 2018-05-24 MED FILL — ONDANSETRON HCL 4 MG TABLET: 4 | 7 days supply | Qty: 20 | Fill #1

## 2018-05-24 MED FILL — ZOLPIDEM TARTRATE 10 MG TAB: 10 | 30 days supply | Qty: 30 | Fill #1

## 2018-05-24 MED FILL — LIOTHYRONINE SODIUM 5 MCG T: 5 | 30 days supply | Qty: 60 | Fill #0

## 2018-05-24 MED FILL — clonazePAM 1 MG TABS: 1 | 60 days supply | Qty: 90 | Fill #2

## 2018-06-23 MED FILL — LIOTHYRONINE SODIUM 5 MCG T: 5 | 30 days supply | Qty: 60 | Fill #1

## 2018-06-23 MED FILL — LEVOTHYROXINE 88 MCG TABLET: 88 | 30 days supply | Qty: 30 | Fill #10

## 2018-07-08 DIAGNOSIS — H5213 Myopia, bilateral: Secondary | ICD-10-CM | POA: Diagnosis not present

## 2018-07-08 DIAGNOSIS — H524 Presbyopia: Secondary | ICD-10-CM | POA: Diagnosis not present

## 2018-07-27 MED FILL — LIOTHYRONINE SODIUM 5 MCG T: 5 | 30 days supply | Qty: 60 | Fill #2

## 2018-07-28 ENCOUNTER — Other Ambulatory Visit: Payer: Self-pay | Admitting: Internal Medicine

## 2018-07-28 DIAGNOSIS — R911 Solitary pulmonary nodule: Secondary | ICD-10-CM

## 2018-07-28 MED FILL — LEVOTHYROXINE 88 MCG TABLET: 88 | 30 days supply | Qty: 30 | Fill #0

## 2018-08-04 ENCOUNTER — Ambulatory Visit
Admission: RE | Admit: 2018-08-04 | Discharge: 2018-08-04 | Disposition: A | Discharge status: Home / Self Care | Payer: 59 | Source: Ambulatory Visit | Attending: Internal Medicine | Admitting: Internal Medicine

## 2018-08-04 DIAGNOSIS — R911 Solitary pulmonary nodule: Secondary | ICD-10-CM

## 2018-08-04 DIAGNOSIS — R918 Other nonspecific abnormal finding of lung field: Secondary | ICD-10-CM | POA: Diagnosis not present

## 2018-08-10 ENCOUNTER — Other Ambulatory Visit: Payer: Self-pay | Admitting: Internal Medicine

## 2018-08-10 DIAGNOSIS — Z1231 Encounter for screening mammogram for malignant neoplasm of breast: Secondary | ICD-10-CM

## 2018-08-25 MED FILL — LIOTHYRONINE SODIUM 5 MCG T: 5 | 30 days supply | Qty: 60 | Fill #3

## 2018-08-25 MED FILL — LEVOTHYROXINE 88 MCG TABLET: 88 | 30 days supply | Qty: 30 | Fill #1

## 2018-09-06 ENCOUNTER — Ambulatory Visit: Payer: 59

## 2018-09-13 ENCOUNTER — Ambulatory Visit
Admission: RE | Admit: 2018-09-13 | Discharge: 2018-09-13 | Disposition: A | Discharge status: Home / Self Care | Payer: 59 | Source: Ambulatory Visit | Attending: Internal Medicine | Admitting: Internal Medicine

## 2018-09-13 DIAGNOSIS — Z1231 Encounter for screening mammogram for malignant neoplasm of breast: Secondary | ICD-10-CM

## 2018-09-26 MED FILL — LIOTHYRONINE SODIUM 5 MCG T: 5 | 30 days supply | Qty: 60 | Fill #4

## 2018-09-26 MED FILL — LEVOTHYROXINE 88 MCG TABLET: 88 | 30 days supply | Qty: 30 | Fill #2

## 2018-10-25 MED FILL — LEVOTHYROXINE 88 MCG TABLET: 88 | 30 days supply | Qty: 30 | Fill #3

## 2018-10-26 MED FILL — LIOTHYRONINE SODIUM 5 MCG T: 5 | 30 days supply | Qty: 60 | Fill #0

## 2018-11-26 DIAGNOSIS — Z0000 Encounter for general adult medical examination without abnormal findings: Secondary | ICD-10-CM | POA: Diagnosis not present

## 2018-11-26 DIAGNOSIS — R82998 Other abnormal findings in urine: Secondary | ICD-10-CM | POA: Diagnosis not present

## 2018-11-26 DIAGNOSIS — E559 Vitamin D deficiency, unspecified: Secondary | ICD-10-CM | POA: Diagnosis not present

## 2018-11-26 DIAGNOSIS — E038 Other specified hypothyroidism: Secondary | ICD-10-CM | POA: Diagnosis not present

## 2018-12-03 DIAGNOSIS — Z1389 Encounter for screening for other disorder: Secondary | ICD-10-CM | POA: Diagnosis not present

## 2018-12-03 DIAGNOSIS — R234 Changes in skin texture: Secondary | ICD-10-CM | POA: Diagnosis not present

## 2018-12-03 DIAGNOSIS — I2510 Atherosclerotic heart disease of native coronary artery without angina pectoris: Secondary | ICD-10-CM | POA: Diagnosis not present

## 2018-12-03 DIAGNOSIS — I712 Thoracic aortic aneurysm, without rupture: Secondary | ICD-10-CM | POA: Diagnosis not present

## 2018-12-03 DIAGNOSIS — G4709 Other insomnia: Secondary | ICD-10-CM | POA: Diagnosis not present

## 2018-12-03 DIAGNOSIS — Z8551 Personal history of malignant neoplasm of bladder: Secondary | ICD-10-CM | POA: Diagnosis not present

## 2018-12-03 DIAGNOSIS — Z0000 Encounter for general adult medical examination without abnormal findings: Secondary | ICD-10-CM | POA: Diagnosis not present

## 2018-12-03 DIAGNOSIS — J449 Chronic obstructive pulmonary disease, unspecified: Secondary | ICD-10-CM | POA: Diagnosis not present

## 2018-12-03 DIAGNOSIS — L408 Other psoriasis: Secondary | ICD-10-CM | POA: Diagnosis not present

## 2018-12-03 DIAGNOSIS — E038 Other specified hypothyroidism: Secondary | ICD-10-CM | POA: Diagnosis not present

## 2018-12-14 ENCOUNTER — Encounter: Payer: Self-pay | Admitting: Cardiovascular Disease

## 2018-12-14 ENCOUNTER — Ambulatory Visit: Payer: 59 | Admitting: Cardiovascular Disease

## 2018-12-14 VITALS — BP 134/84 | HR 80 | Ht 69.0 in | Wt 187.0 lb

## 2018-12-14 DIAGNOSIS — R0602 Shortness of breath: Secondary | ICD-10-CM | POA: Diagnosis not present

## 2018-12-14 DIAGNOSIS — I712 Thoracic aortic aneurysm, without rupture: Secondary | ICD-10-CM

## 2018-12-14 DIAGNOSIS — I7121 Aneurysm of the ascending aorta, without rupture: Secondary | ICD-10-CM

## 2018-12-14 DIAGNOSIS — I2510 Atherosclerotic heart disease of native coronary artery without angina pectoris: Secondary | ICD-10-CM

## 2018-12-14 DIAGNOSIS — I739 Peripheral vascular disease, unspecified: Secondary | ICD-10-CM

## 2018-12-14 HISTORY — DX: Atherosclerotic heart disease of native coronary artery without angina pectoris: I25.10

## 2018-12-14 HISTORY — DX: Aneurysm of the ascending aorta, without rupture: I71.21

## 2018-12-14 HISTORY — DX: Thoracic aortic aneurysm, without rupture: I71.2

## 2018-12-14 NOTE — Addendum Note (Signed)
Addended by: Alvina Filbert B on: 12/14/2018 03:55 PM   Modules accepted: Orders

## 2018-12-14 NOTE — Patient Instructions (Signed)
Medication Instructions:  Your physician recommends that you continue on your current medications as directed. Please refer to the Current Medication list given to you today.  If you need a refill on your cardiac medications before your next appointment, please call your pharmacy.   Lab work: NONE  Testing/Procedures: Your physician has requested that you have an exercise tolerance test. For further information please visit [email or web address]. Please also follow instruction sheet, as given.  Your physician has requested that you have an ankle brachial index (ABI). During this test an ultrasound and blood pressure cuff are used to evaluate the arteries that supply the arms and legs with blood. Allow thirty minutes for this exam. There are no restrictions or special instructions.  Your physician has requested that you have a lower or upper extremity arterial duplex. This test is an ultrasound of the arteries in the legs or arms. It looks at arterial blood flow in the legs and arms. Allow one hour for Lower and Upper Arterial scans. There are no restrictions or special instructions  Follow-Up: At [facility], you and your health needs are our priority.  As part of our continuing mission to provide you with exceptional heart care, we have created designated Provider Care Teams.  These Care Teams include your primary Cardiologist (physician) and Advanced Practice Providers (APPs -  Physician Assistants and Nurse Practitioners) who all work together to provide you with the care you need, when you need it. You will need a follow up appointment in 3 months. You may see DR [facility] or one of the following Advanced Practice Providers on your designated Care Team:   Kerin Ransom, PA-C Roby Lofts, [place] . Sande Rives, PA-C  Any Other Special Instructions Will Be Listed Below (If Applicable). TRY TO EXERCISE 150 MINUTES EACH WEEK   1-800-QUITNOW FOR SMOKING IF YOU CHOOSE TO CALL   Exercise  Stress Test An exercise stress test is a test to check how your heart works during exercise. You will need to walk on a treadmill or ride an exercise bike for this test. An electrocardiogram (ECG) will record your heartbeat when you are at rest and when you are exercising. You may have an ultrasound or nuclear test after the exercise test. The test is done to check for coronary artery disease (CAD). It is also done to:  See how well you can exercise.  Watch for high blood pressure during exercise.  Test how well you can exercise after treatment.  Check the blood flow to your arms and legs. If your test result is not normal, more testing may be needed. What happens before the procedure?  Follow instructions from your doctor about what you cannot eat or drink. ? Do not have any drinks or foods that have caffeine in them for 24 hours before the test, or as told by your doctor. This includes coffee, tea (even decaf tea), sodas, chocolate, and cocoa.  Ask your doctor about changing or stopping your normal medicines. This is important if you: ? Take diabetes medicines. ? Take beta-blocker medicines. ? Wear a nitroglycerin patch.  If you use an inhaler, bring it with you to the test.  Do not put lotions, powders, creams, or oils on your chest before the test.  Wear comfortable shoes and clothing.  Do not use any products that have nicotine or tobacco in them, such as cigarettes and e-cigarettes. Stop using them at least 4 hours before the test. If you need help quitting, ask your doctor. What  happens during the procedure?  Patches (electrodes) will be put on your chest.  Wires will be connected to the patches. The wires will send signals to a machine to record your heartbeat.  Your heart rate will be watched while you are resting and while you are exercising. Your blood pressure will also be watched during the test.  You will walk on a treadmill or use a stationary bike. If you cannot  use these, you may be asked to turn a crank with your hands.  The activity will get harder and will raise your heart rate.  You may be asked to breathe into a tube a few times during the test. This measures the gases that you breathe out.  You will be asked how you are feeling throughout the test.  You will exercise until your heart reaches a target heart rate. You will stop early if: ? You feel dizzy. ? You have chest pain. ? You are out of breath. ? Your blood pressure is too high or too low. ? You have an irregular heartbeat. ? You have pain or aching in your arms or legs. The procedure may vary among doctors and hospitals. What happens after the procedure?  Your blood pressure, heart rate, breathing rate, and blood oxygen level will be watched after the test.  You may return to your normal diet and activities as told by your doctor.  It is up to you to get the results of your test. Ask your doctor, or the department that is doing the test, when your results will be ready. Summary  An exercise stress test is a test to check how your heart works during exercise.  This test is done to check for coronary artery disease.  Your heart rate will be watched while you are resting and while you are exercising.  Follow instructions from your doctor about what you cannot eat or drink before the test. This information is not intended to replace advice given to you by your health care provider. Make sure you discuss any questions you have with your health care provider. Document Released: 04/28/2008 Document Revised: 02/10/2017 Document Reviewed: 02/10/2017 Elsevier Interactive Patient Education  2019 [place]han Index Test Why am I having this test? The ankle-brachial index (ABI) test is used to diagnose peripheral vascular disease (PVD). PVD is also known as peripheral arterial disease (PAD). PVD is the blocking or hardening of the arteries anywhere within the  circulatory system beyond the heart. PVD is caused by:  Cholesterol deposits in your blood vessels (atherosclerosis). This is the most common cause of this condition.  Irritation and swelling (inflammation) in the blood vessels.  Blood clots in the vessels. Cholesterol deposits cause arteries to narrow. Normal delivery of oxygen to your tissues is affected, causing muscle pain and fatigue. This is called claudication. PVD means that there may also be a buildup of cholesterol:  In your heart. This increases the risk of heart attacks.  In your brain. This increases the risk of strokes. What is being tested? The ankle-brachial index test measures the blood flow in your arms and legs. The blood flow will show if blood vessels in your legs have been narrowed by cholesterol deposits. How do I prepare for this test?  Wear loose clothing.  Do not use any tobacco products, including cigarettes, chewing tobacco, or e-cigarettes, for at least 30 minutes before the test. What happens during the test?  1. You will lie down in a resting position.  2. Your health care provider will use a blood pressure machine and a small ultrasound device (Doppler) to measure the systolic pressures on your upper arms and ankles. Systolic pressure is the pressure inside your arteries when your heart pumps. 3. Systolic pressure measurements will be taken several times, and at several points, on both the ankle and the arm. 4. Your health care provider will divide the highest systolic pressure of the ankle by the highest systolic pressure of the arm. The result is the ankle-brachial pressure ratio, or ABI. Sometimes this test will be repeated after you have exercised on a treadmill for 5 minutes. You may have leg pain during the exercise portion of the test if you suffer from PVD. If the index number drops after exercise, this may show that PVD is present. How are the results reported? Your test results will be reported as  a value that shows the ratio of your ankle pressure to your arm pressure (ABI ratio). Your health care provider will compare your results to normal ranges that were established after testing a large group of people (reference ranges). Reference ranges may vary among labs and hospitals. For this test, a common reference range is:  ABI ratio of 0.9 to 1.3. What do the results mean? An ABI ratio that is below the reference range is considered abnormal and may indicate PVD in the legs. Talk with your health care provider about what your results mean. Questions to ask your health care provider Ask your health care provider, or the department that is doing the test:  When will my results be ready?  How will I get my results?  What are my treatment options?  What other tests do I need?  What are my next steps? Summary  The ankle-brachial index (ABI) test is used to diagnose peripheral vascular disease (PVD). PVD is also known as peripheral arterial disease (PAD).  The ankle-brachial index test measures the blood flow in your arms and legs.  The highest systolic pressure of the ankle is divided by the highest systolic pressure of the arm. The result is the ABI ratio.  An ABI ratio that is below 0.9 is considered abnormal and may indicate PVD in the legs. This information is not intended to replace advice given to you by your health care provider. Make sure you discuss any questions you have with your health care provider. Document Released: 11/14/2004 Document Revised: 08/04/2017 Document Reviewed: 08/04/2017 Elsevier Interactive Patient Education  [employer].

## 2018-12-14 NOTE — Progress Notes (Signed)
Cardiology Office Note   Date:  12/14/2018   ID:  Felicia Leonard, DOB August 25, 1961, MRN [id number]  PCP:  Velna Hatchet, MD  Cardiologist:   Skeet Latch, MD   No chief complaint on file.     History of Present Illness: Felicia Leonard is a 58 y.o. female with mild ascending aorta aneurysm, coronary calcification, COPD and prior bladder cancerwho is being seen today for the evaluation of coronary calcification at the request of Velna Hatchet, MD.  She had a chest CT 07/2018 that showed a small ascending aorta aneurysm (4.0 cm) and calcification of the LAD.  She has been mostly feeling well from a cardiac standpoint.  She does not get much exercise.  She has a disabled husband and works long hours and does not find time to exercise.  However, she does notice that when she goes up and down the steps several times she gets short of breath.  The only time she has chest pain is when she bends over quickly, and she attributes this to a hiatal hernia.  She occasionally has lower extremity edema, mostly when she is traveling and walking around a lot.  She has no orthopnea or PND.  Her main complaint is about a rash on her inner thighs and inner lower legs that has been present for over a year.  She has seen dermatology and there is no clear diagnosis.  She has a second opinion coming up soon.  Her PCP noted that it occurs in a dermatomal distribution.  It has never been biopsied.  Is very tender and her skin is very thin in that region.  She reports occasional palpitations that last for a few seconds at a time.  There is no associated chest pain, shortness of breath, lightheadedness, or dizziness.  She does note pain in her R leg when walking at times.   Ms. Belle has been smoking 1 pack of cigarettes daily since age 49.  She had some success with quitting with Chantix.  However she subsequently developed a rash and had to stop taking it.  Nicotine replacement gum did not work for her and she is  afraid to use any patches because of her skin conditions.  She has a new [place] and is motivated to try and quit again.   Past Medical History:  Diagnosis Date  . Allergy   . Anal fissure   . Aneurysm of ascending aorta (HCC) 12/14/2018   4.0 cm 07/2018.  Marland Kitchen Anxiety   . Arthritis   . Asthma   . Bladder cancer ([place])   . Claudication ([place]) 12/14/2018  . Colon polyps    TUBULAR ADENOMA   . Coronary artery calcification seen on CAT scan 12/14/2018  . Depression   . Diverticulitis   . Diverticulosis   . Endometriosis   . Enlarged liver   . Fatty liver   . Fibroid   . Genital warts   . GERD (gastroesophageal reflux disease)   . Hemorrhoids   . Hepatomegaly   . Hiatal hernia   . Pneumonia   . Thyroid disease   . UTI (lower urinary tract infection)   . Varicose veins of both lower extremities     Past Surgical History:  Procedure Laterality Date  . BLADDER SURGERY    . BRONCHOSCOPY    . CESAREAN SECTION    . MICROLARYNGOSCOPY N/A 03/26/2018   Procedure: MICROLARYNGOSCOPY WITH VOCAL CORD BIOPSY;  Surgeon: Melida Quitter, MD;  Location: [facility];  Service: ENT;  Laterality: N/A;  . SPINE SURGERY    . TONSILLECTOMY    . VAGINAL HYSTERECTOMY  2004     Current Outpatient Medications  Medication Sig Dispense Refill  . Betamethasone Valerate 0.12 % foam as needed.    . calcipotriene (DOVONOX) 0.005 % cream     . calcipotriene-betamethasone (TACLONEX) ointment as needed.    . cetirizine (ZYRTEC) 10 MG tablet Take 10 mg by mouth daily.    . Cholecalciferol (VITAMIN D-3 PO) Take 1 tablet by mouth daily.    . Clobetasol Propionate Emulsion 0.05 % topical foam     . clonazePAM (KLONOPIN) 1 MG tablet   0  . Cyanocobalamin (VITAMIN B-12 CR PO) Take 1 tablet by mouth daily.    . Estradiol (VAGIFEM) 10 MCG TABS vaginal tablet Place 1 tablet (10 mcg total) vaginally 2 (two) times a week. 24 tablet 4  . levothyroxine (SYNTHROID, LEVOTHROID) 88 MCG tablet Take 88 mcg by  mouth daily before breakfast.    . liothyronine (CYTOMEL) 5 MCG tablet Take 10 mcg by mouth daily.  0  . methocarbamol (ROBAXIN) 500 MG tablet TK 1 T PO BID PRF MUSCLE PAIN    . Naphazoline-Pheniramine (OPCON-A OP) Apply to eye daily.    . ondansetron (ZOFRAN) 4 MG tablet Take 1 tablet (4 mg total) by mouth every 8 (eight) hours as needed for nausea or vomiting. 20 tablet 1  . zolpidem (AMBIEN) 10 MG tablet as needed. Reported on 02/12/2016  0   No current facility-administered medications for this visit.     Allergies:   Chantix [varenicline]; Erythromycin base; Penicillins; Sulfa antibiotics; Sulfasalazine; and Prilosec [omeprazole]    Social History:  The patient  reports that she has been smoking cigarettes. She has been smoking about 1.00 pack per day. She has never used smokeless tobacco. She reports that she does not drink alcohol or use drugs.   Family History:  The patient's family history includes Alcohol abuse in her father; Breast cancer in her maternal grandmother; Cancer in her mother; Colon cancer in her mother; Dementia in her maternal grandmother and paternal grandmother; Heart attack in her maternal grandfather; Heart disease in her father; Hypertension in her father; Liver cancer in her mother and paternal uncle; Peripheral Artery Disease in her maternal grandmother; Stroke in her maternal grandmother; Thyroid disease in her father.    ROS:  Please see the history of present illness.   Otherwise, review of systems are positive for none.   All other systems are reviewed and negative.    PHYSICAL EXAM: VS:  BP 134/84   Pulse 80   Ht 5\' 9"  (1.753 m)   Wt 187 lb (84.8 kg)   LMP 11/24/2002   BMI 27.62 kg/m  , BMI Body mass index is 27.62 kg/m. GENERAL:  Well appearing HEENT:  Pupils equal round and reactive, fundi not visualized, oral mucosa unremarkable NECK:  No jugular venous distention, waveform within normal limits, carotid upstroke brisk and symmetric, no  bruits LUNGS:  Clear to auscultation bilaterally HEART:  RRR.  PMI not displaced or sustained,S1 and S2 within normal limits, no S3, no S4, no clicks, no rubs, no murmurs ABD:  Flat, positive bowel sounds normal in frequency in pitch, no bruits, no rebound, no guarding, no midline pulsatile mass, no hepatomegaly, no splenomegaly EXT:  2 DP/PT on the L.  1+ DP/PT on the R.  No edema, no cyanosis no clubbing SKIN:  Scattered psoriatic lesions.  Hyperpigemented macular rash  in  NEURO:  Cranial nerves II through XII grossly intact, motor grossly intact throughout [facility]:  Cognitively intact, oriented to person place and time   EKG:  EKG is ordered today. The ekg ordered today demonstrates sinus rhythm.  Rate 80 bpm.     Recent Labs: No results found for requested labs within last 8760 hours.  11/26/2018: Total cholesterol 127, triglycerides 76, HDL 63, LDL 49 ALT 38   Lipid Panel No results found for: CHOL, TRIG, HDL, CHOLHDL, VLDL, LDLCALC, LDLDIRECT    Wt Readings from Last 3 Encounters:  12/14/18 187 lb (84.8 kg)  03/26/18 188 lb (85.3 kg)  03/09/18 188 lb (85.3 kg)      ASSESSMENT AND PLAN:  # Coronary calcification: CT showed LAD calcification.  She isn't very physically active so it is challenging to know if she is symptomatic.  LDL 49 11/2018.  No statin.  We will get an ETT.  Add aspirin 81mg  daily.  # LE edema: Only happens with travel.  Recommend limiting sodium intake and wearing compression stockings.  # Elevated BP: Given her coronary disease her BP should be <130/80.  She is very close.  Recommend working on increasing her exercise to at least 150 minutes per week.  Smoking cessation advised.    # Tobacco abuse: Patient was given the number for 1-800-QUIT-NOW.  Offered Care Guide referral and she declined for now.  # Ascending aorta aneurysm: 4.0 cm on CT.  This will be repeated in 1 year.  # Claudication: Check ABIs.  R LE pulses are diminished.    Current  medicines are reviewed at length with the patient today.  The patient does not have concerns regarding medicines.  The following changes have been made:  Add aspirin 81mg  daily  Labs/ tests ordered today include:   Orders Placed This Encounter  Procedures  . Exercise Tolerance Test     Disposition:   FU with Owyn Raulston C. Oval Linsey, MD, [facility] in 3 months.     Signed, Tyshon Fanning C. Oval Linsey, MD, [facility]  12/14/2018 10:02 AM    [place]

## 2018-12-24 ENCOUNTER — Telehealth (HOSPITAL_COMMUNITY): Payer: Self-pay

## 2018-12-24 NOTE — Telephone Encounter (Signed)
Encounter complete. 

## 2018-12-29 ENCOUNTER — Ambulatory Visit (HOSPITAL_BASED_OUTPATIENT_CLINIC_OR_DEPARTMENT_OTHER)
Admission: RE | Admit: 2018-12-29 | Discharge: 2018-12-29 | Disposition: A | Discharge status: Home / Self Care | Payer: 59 | Source: Ambulatory Visit | Attending: Cardiovascular Disease | Admitting: Cardiovascular Disease

## 2018-12-29 ENCOUNTER — Encounter (HOSPITAL_COMMUNITY): Payer: 59

## 2018-12-29 ENCOUNTER — Ambulatory Visit (HOSPITAL_COMMUNITY)
Admission: RE | Admit: 2018-12-29 | Discharge: 2018-12-29 | Disposition: A | Discharge status: Home / Self Care | Payer: 59 | Source: Ambulatory Visit | Attending: Cardiology | Admitting: Cardiology

## 2018-12-29 DIAGNOSIS — I2510 Atherosclerotic heart disease of native coronary artery without angina pectoris: Secondary | ICD-10-CM | POA: Insufficient documentation

## 2018-12-29 DIAGNOSIS — I739 Peripheral vascular disease, unspecified: Secondary | ICD-10-CM | POA: Insufficient documentation

## 2018-12-29 DIAGNOSIS — R0602 Shortness of breath: Secondary | ICD-10-CM | POA: Insufficient documentation

## 2018-12-30 LAB — EXERCISE TOLERANCE TEST
Estimated workload: 10.1 METS
Exercise duration (min): 9 min
Exercise duration (sec): 0 s
MPHR: 163 {beats}/min
Peak HR: 146 {beats}/min
Percent HR: 89 %
RPE: 17
Rest HR: 85 {beats}/min

## 2019-03-11 ENCOUNTER — Ambulatory Visit: Payer: 59 | Admitting: Certified Nurse Midwife

## 2019-03-14 ENCOUNTER — Telehealth: Payer: Self-pay

## 2019-03-14 NOTE — Telephone Encounter (Signed)
LEFT VM FOR PT TO RETURN CALL TO MAKE APPT VIRTUAL. PREFERABLE DOXY.ME

## 2019-03-14 NOTE — Telephone Encounter (Signed)
Patient is returning call regarding appt

## 2019-03-15 ENCOUNTER — Telehealth: Payer: Self-pay

## 2019-03-15 MED FILL — LEVOTHYROXINE 88 MCG TABLET: 88 | 30 days supply | Qty: 30 | Fill #0

## 2019-03-15 MED FILL — LIOTHYRONINE SODIUM 5 MCG T: 5 | 30 days supply | Qty: 60 | Fill #0

## 2019-03-15 NOTE — Telephone Encounter (Signed)
Virtual Visit Pre-Appointment Phone Call [place] PHONE  "(Name), I am calling you today to discuss your upcoming appointment. We are currently trying to limit exposure to the virus that causes COVID-19 by seeing patients at home rather than in the office."  1. "What is the BEST phone number to call the day of the visit?" - include this in appointment notes  2. "Do you have or have access to (through a family member/friend) a smartphone with video capability that we can use for your visit?" a. If yes - list this number in appt notes as "cell" (if different from BEST phone #) and list the appointment type as a VIDEO visit in appointment notes b. If no - list the appointment type as a PHONE visit in appointment notes  3. Confirm consent - "In the setting of the current Covid19 crisis, you are scheduled for a (phone or video) visit with your provider on (date) at (time).  Just as we do with many in-office visits, in order for you to participate in this visit, we must obtain consent.  If you'd like, I can send this to your mychart (if signed up) or email for you to review.  Otherwise, I can obtain your verbal consent now.  All virtual visits are billed to your insurance company just like a normal visit would be.  By agreeing to a virtual visit, we'd like you to understand that the technology does not allow for your provider to perform an examination, and thus may limit your provider's ability to fully assess your condition. If your provider identifies any concerns that need to be evaluated in person, we will make arrangements to do so.  Finally, though the technology is pretty good, we cannot assure that it will always work on either your or our end, and in the setting of a video visit, we may have to convert it to a phone-only visit.  In either situation, we cannot ensure that we have a secure connection.  Are you willing to proceed?" STAFF: Did the patient verbally acknowledge consent to  telehealth visit? Document YES/NO here:   4. Advise patient to be prepared - "Two hours prior to your appointment, go ahead and check your blood pressure, pulse, oxygen saturation, and your weight (if you have the equipment to check those) and write them all down. When your visit starts, your provider will ask you for this information. If you have an Apple Watch or Kardia device, please plan to have heart rate information ready on the day of your appointment. Please have a pen and paper handy nearby the day of the visit as well."  5. Give patient instructions for MyChart download to smartphone OR Doximity/Doxy.me as below if video visit (depending on what platform provider is using)  6. Inform patient they will receive a phone call 15 minutes prior to their appointment time (may be from unknown caller ID) so they should be prepared to answer    TELEPHONE CALL NOTE  Felicia Leonard has been deemed a candidate for a follow-up tele-health visit to limit community exposure during the Covid-19 pandemic. I spoke with the patient via phone to ensure availability of phone/video source, confirm preferred email & phone number, and discuss instructions and expectations.  I reminded Felicia Leonard to be prepared with any vital sign and/or heart rhythm information that could potentially be obtained via home monitoring, at the time of her visit. I reminded Felicia Leonard to expect a phone call prior  to her visit.  [place] 03/15/2019 11:24 AM   INSTRUCTIONS FOR DOWNLOADING THE MYCHART APP TO SMARTPHONE  - The patient must first make sure to have activated MyChart and know their login information - If Apple, go to [employer] and type in MyChart in the search bar and download the app. If Android, ask patient to go to [employer] and type in [place] in the search bar and download the app. The app is free but as with any other app downloads, their phone may require them to verify saved payment  information or Apple/Android password.  - The patient will need to then log into the app with their MyChart username and password, and select [facility] as their healthcare provider to link the account. When it is time for your visit, go to the MyChart app, find appointments, and click Begin Video Visit. Be sure to Select Allow for your device to access the Microphone and Camera for your visit. You will then be connected, and your provider will be with you shortly.  **If they have any issues connecting, or need assistance please contact [facility] [phone])  **If using a computer, in order to ensure the best quality for their visit they will need to use either of the following Internet Browsers: [employer], or Google Chrome  IF USING DOXIMITY or DOXY.ME - The patient will receive a link just prior to their visit by text.     FULL LENGTH CONSENT FOR TELE-HEALTH VISIT   I hereby voluntarily request, consent and authorize [facility] and its employed or contracted physicians, physician assistants, nurse practitioners or other licensed health care professionals (the Practitioner), to provide me with telemedicine health care services (the "Services") as deemed necessary by the treating Practitioner. I acknowledge and consent to receive the Services by the Practitioner via telemedicine. I understand that the telemedicine visit will involve communicating with the Practitioner through live audiovisual communication technology and the disclosure of certain medical information by electronic transmission. I acknowledge that I have been given the opportunity to request an in-person assessment or other available alternative prior to the telemedicine visit and am voluntarily participating in the telemedicine visit.  I understand that I have the right to withhold or withdraw my consent to the use of telemedicine in the course of my care at any time, without affecting my right to  future care or treatment, and that the Practitioner or I may terminate the telemedicine visit at any time. I understand that I have the right to inspect all information obtained and/or recorded in the course of the telemedicine visit and may receive copies of available information for a reasonable fee.  I understand that some of the potential risks of receiving the Services via telemedicine include:  Marland Kitchen Delay or interruption in medical evaluation due to technological equipment failure or disruption; . Information transmitted may not be sufficient (e.g. poor resolution of images) to allow for appropriate medical decision making by the Practitioner; and/or  . In rare instances, security protocols could fail, causing a breach of personal health information.  Furthermore, I acknowledge that it is my responsibility to provide information about my medical history, conditions and care that is complete and accurate to the best of my ability. I acknowledge that Practitioner's advice, recommendations, and/or decision may be based on factors not within their control, such as incomplete or inaccurate data provided by me or distortions of diagnostic images or specimens that may result from electronic  transmissions. I understand that the practice of medicine is not an exact science and that Practitioner makes no warranties or guarantees regarding treatment outcomes. I acknowledge that I will receive a copy of this consent concurrently upon execution via email to the email address I last provided but may also request a printed copy by calling the office of [facility].    I understand that my insurance will be billed for this visit.   I have read or had this consent read to me. . I understand the contents of this consent, which adequately explains the benefits and risks of the Services being provided via telemedicine.  . I have been provided ample opportunity to ask questions regarding this consent and the Services  and have had my questions answered to my satisfaction. . I give my informed consent for the services to be provided through the use of telemedicine in my medical care  By participating in this telemedicine visit I agree to the above.

## 2019-03-18 ENCOUNTER — Telehealth: Payer: Self-pay | Admitting: Cardiovascular Disease

## 2019-03-18 NOTE — Telephone Encounter (Signed)
Call home phone/ consent/ my chart/ pre reg completed

## 2019-03-21 ENCOUNTER — Telehealth: Payer: Self-pay | Admitting: *Deleted

## 2019-03-21 ENCOUNTER — Encounter: Payer: Self-pay | Admitting: Cardiovascular Disease

## 2019-03-21 ENCOUNTER — Telehealth (INDEPENDENT_AMBULATORY_CARE_PROVIDER_SITE_OTHER): Payer: 59 | Admitting: Cardiovascular Disease

## 2019-03-21 DIAGNOSIS — I712 Thoracic aortic aneurysm, without rupture, unspecified: Secondary | ICD-10-CM

## 2019-03-21 DIAGNOSIS — I2510 Atherosclerotic heart disease of native coronary artery without angina pectoris: Secondary | ICD-10-CM

## 2019-03-21 DIAGNOSIS — Z720 Tobacco use: Secondary | ICD-10-CM

## 2019-03-21 DIAGNOSIS — I7121 Aneurysm of the ascending aorta, without rupture: Secondary | ICD-10-CM

## 2019-03-21 MED FILL — clonazePAM 1 MG TABS: 1 | 60 days supply | Qty: 90 | Fill #0

## 2019-03-21 NOTE — Telephone Encounter (Signed)
Left message to call back to go over AVS from today

## 2019-03-21 NOTE — Progress Notes (Signed)
Virtual Visit via Telephone Note   This visit type was conducted due to national recommendations for restrictions regarding the COVID-19 Pandemic (e.g. social distancing) in an effort to limit this patient's exposure and mitigate transmission in our community.  Due to her co-morbid illnesses, this patient is at least at moderate risk for complications without adequate follow up.  This format is felt to be most appropriate for this patient at this time.  The patient did not have access to video technology/had technical difficulties with video requiring transitioning to audio format only (telephone).  All issues noted in this document were discussed and addressed.  No physical exam could be performed with this format.  Please refer to the patient's chart for her  consent to telehealth for [facility].   Evaluation Performed:  Follow-up visit  Date:  03/21/2019   ID:  Felicia Leonard, DOB [id number], MRN [id number]  Patient Location: Home Provider Location: Office  PCP:  Velna Hatchet, MD  Cardiologist:  Skeet Latch, MD  Electrophysiologist:  None   Chief Complaint:  Follow up  History of Present Illness:    Felicia Leonard is a 58 y.o. female with mild ascending aorta aneurysm, coronary calcification, COPD, ongoing tobacco use, hiatal hernia and prior bladder cancer here for follow up.  She was initially seen 11/2018 for the evaluation of coronary calcification.  She had a chest CT 07/2018 that showed a small ascending aorta aneurysm (4.0 cm) and calcification of the LAD.  She was feeling well at that appointment but had exertional dyspnea and did not get any exercise.  Therefore she was referred for an ETT 12/29/2018 that was negative for ischemia.  She achieved 10.1 METS on a Bruce protocol.  Right lower extremity pulses were diminished and she was referred for ABIs 12/29/2018 that revealed normal blood flow in both legs.     At her last appointment Ms. Mckeen was encouraged to quit  smoking and was given a referral to the care guide. She called 1-800-QUIT-NOW and was very motivated but then COVID-19 happened and she has been unable to start.  Her blood pressure was mildly elevated and she was encouraged to work on diet and exercise.  Since her last appointment Ms. Dlugosz has been doing well.  She has been feeling tired but thinks this is due to depression.  She has been staying at home 2/2 COVID-19.  She complains of numbness in the R shoulder to elbow, which is chronic.  She has a disabled husband and works long hours and does not find time to exercise.    The patient does not have symptoms concerning for COVID-19 infection (fever, chills, cough, or new shortness of breath).    Past Medical History:  Diagnosis Date  . Allergy   . Anal fissure   . Aneurysm of ascending aorta (HCC) 12/14/2018   4.0 cm 07/2018.  Marland Kitchen Anxiety   . Arthritis   . Asthma   . Bladder cancer ([place])   . Claudication ([place]) 12/14/2018  . Colon polyps    TUBULAR ADENOMA   . Coronary artery calcification seen on CAT scan 12/14/2018  . Depression   . Diverticulitis   . Diverticulosis   . Endometriosis   . Enlarged liver   . Fatty liver   . Fibroid   . Genital warts   . GERD (gastroesophageal reflux disease)   . Hemorrhoids   . Hepatomegaly   . Hiatal hernia   . Pneumonia   . Thyroid disease   .  UTI (lower urinary tract infection)   . Varicose veins of both lower extremities    Past Surgical History:  Procedure Laterality Date  . BLADDER SURGERY    . BRONCHOSCOPY    . CESAREAN SECTION    . MICROLARYNGOSCOPY N/A 03/26/2018   Procedure: MICROLARYNGOSCOPY WITH VOCAL CORD BIOPSY;  Surgeon: Melida Quitter, MD;  Location: [facility];  Service: ENT;  Laterality: N/A;  . SPINE SURGERY    . TONSILLECTOMY    . VAGINAL HYSTERECTOMY  2004     Current Meds  Medication Sig  . Betamethasone Valerate 0.12 % foam as needed.  . calcipotriene (DOVONOX) 0.005 % cream   .  calcipotriene-betamethasone (TACLONEX) ointment as needed.  . cetirizine (ZYRTEC) 10 MG tablet Take 10 mg by mouth daily.  . Cholecalciferol (VITAMIN D-3 PO) Take 1 tablet by mouth daily.  . Clobetasol Propionate Emulsion 0.05 % topical foam   . Cyanocobalamin (VITAMIN B-12 CR PO) Take 1 tablet by mouth daily.  . Estradiol (VAGIFEM) 10 MCG TABS vaginal tablet Place 1 tablet (10 mcg total) vaginally 2 (two) times a week.  . levothyroxine (SYNTHROID, LEVOTHROID) 88 MCG tablet Take 88 mcg by mouth daily before breakfast.  . liothyronine (CYTOMEL) 5 MCG tablet Take 10 mcg by mouth daily.  . methocarbamol (ROBAXIN) 500 MG tablet TK 1 T PO BID PRF MUSCLE PAIN  . Naphazoline-Pheniramine (OPCON-A OP) Apply to eye daily.  . ondansetron (ZOFRAN) 4 MG tablet Take 1 tablet (4 mg total) by mouth every 8 (eight) hours as needed for nausea or vomiting.  Marland Kitchen zolpidem (AMBIEN) 10 MG tablet as needed. Reported on 02/12/2016     Allergies:   Chantix [varenicline]; Erythromycin base; Penicillins; Sulfa antibiotics; Sulfasalazine; and Prilosec [omeprazole]   Social History   Tobacco Use  . Smoking status: Current Every Day Smoker    Packs/day: 1.00    Types: Cigarettes  . Smokeless tobacco: Never Used  Substance Use Topics  . Alcohol use: No    Alcohol/week: 0.0 standard drinks  . Drug use: No     Family Hx: The patient's family history includes Alcohol abuse in her father; Breast cancer in her maternal grandmother; Cancer in her mother; Colon cancer in her mother; Dementia in her maternal grandmother and paternal grandmother; Heart attack in her maternal grandfather; Heart disease in her father; Hypertension in her father; Liver cancer in her mother and paternal uncle; Peripheral Artery Disease in her maternal grandmother; Stroke in her maternal grandmother; Thyroid disease in her father. There is no history of Esophageal cancer, Pancreatic cancer, Rectal cancer, or Stomach cancer.  ROS:   Please see the  history of present illness.    depression All other systems reviewed and are negative.   Prior CV studies:   The following studies were reviewed today:  ETT 12/29/2018:  Blood pressure demonstrated a normal response to exercise.  There was no ST segment deviation noted during stress.   ETT with good exercise tolerance (9:00); no chest pain; normal blood pressure response; no ST changes; negative adequate exercise tolerance test; Duke treadmill score 9.  Labs/Other Tests and Data Reviewed:    EKG:  An ECG dated 12/14/18 was personally reviewed today and demonstrated:  sinus rhythm.  Rate 80 bpm.    Recent Labs: No results found for requested labs within last 8760 hours.   Recent Lipid Panel No results found for: CHOL, TRIG, HDL, CHOLHDL, LDLCALC, LDLDIRECT  Wt Readings from Last 3 Encounters:  12/14/18 187 lb (84.8  kg)  03/26/18 188 lb (85.3 kg)  03/09/18 188 lb (85.3 kg)     Objective:    Ht 5\' 9"  (1.753 m)   LMP 11/24/2002   BMI 27.62 kg/m  GENERAL: Sounds well.  No acute distress. NEURO:  Speech fluent.   PSYCH:  Cognitively intact, oriented to person place and time   ASSESSMENT & PLAN:    # Coronary calcification: CT showed LAD calcification.  She had an ETT that was negative for ischemia 12/2018.  LDL 49 11/2018.  No statin. Continue aspirin 81mg  daily.     # LE edema: Only happens with travel.  Recommend limiting sodium intake and wearing compression stockings.  # Elevated BP: Given her coronary disease her BP should be <130/80.  She is very close.  Recommend working on increasing her exercise to at least 150 minutes per week.  Smoking cessation advised.    # Tobacco abuse: Patient was given the number for 1-800-QUIT-NOW.  She will try again after COVID-19.  # Ascending aorta aneurysm: 4.0 cm on CT.  Repeat 07/2019.  # Claudication: ABIs normal 12/2018.  Continue aspirin.   COVID-19 Education: The signs and symptoms of COVID-19 were discussed with the  patient and how to seek care for testing (follow up with PCP or arrange E-visit).  The importance of social distancing was discussed today.  Time:   Today, I have spent 21 minutes with the patient with telehealth technology discussing the above problems.     Medication Adjustments/Labs and Tests Ordered: Current medicines are reviewed at length with the patient today.  Concerns regarding medicines are outlined above.   Tests Ordered: Orders Placed This Encounter  Procedures  . CT Chest Wo Contrast    Medication Changes: No orders of the defined types were placed in this encounter.   Disposition:  Follow up in 1 year(s)  Signed, Skeet Latch, MD  03/21/2019 9:33 AM    [place]

## 2019-03-21 NOTE — Telephone Encounter (Signed)
Follow up  ° ° °Patient is returning call.  °

## 2019-03-21 NOTE — Telephone Encounter (Signed)
Discussed AVS with patient

## 2019-03-21 NOTE — Patient Instructions (Addendum)
Medication Instructions:  Your physician recommends that you continue on your current medications as directed. Please refer to the Current Medication list given to you today.  If you need a refill on your cardiac medications before your next appointment, please call your pharmacy.   Lab work: NONE  Testing/Procedures: NON CONTRAST CT OF CHEST TO BE DONE 07/2019  Follow-Up: At [facility], you and your health needs are our priority.  As part of our continuing mission to provide you with exceptional heart care, we have created designated Provider Care Teams.  These Care Teams include your primary Cardiologist (physician) and Advanced Practice Providers (APPs -  Physician Assistants and Nurse Practitioners) who all work together to provide you with the care you need, when you need it. You will need a follow up appointment in 12 months.  Please call our office 2 months in advance to schedule this appointment.  You may see Skeet Latch, MD or one of the following Advanced Practice Providers on your designated Care Team:   Kerin Ransom, PA-C Roby Lofts, [place] . Sande Rives, PA-C  Any Other Special Instructions Will Be Listed Below (If Applicable).  Call 1-800-quit now again after COVID-19 and she is ready to quit smoking

## 2019-04-13 MED FILL — LIOTHYRONINE SODIUM 5 MCG T: 5 | 30 days supply | Qty: 60 | Fill #1

## 2019-04-13 MED FILL — LEVOTHYROXINE 88 MCG TABLET: 88 | 30 days supply | Qty: 30 | Fill #1

## 2019-05-21 MED FILL — LEVOTHYROXINE 88 MCG TABLET: 88 | 30 days supply | Qty: 30 | Fill #2

## 2019-05-23 MED FILL — LIOTHYRONINE SODIUM 5 MCG T: 5 | 90 days supply | Qty: 180 | Fill #0

## 2019-05-24 MED FILL — clonazePAM 1 MG TABS: 1 | 60 days supply | Qty: 90 | Fill #0

## 2019-05-30 ENCOUNTER — Ambulatory Visit (INDEPENDENT_AMBULATORY_CARE_PROVIDER_SITE_OTHER): Payer: 59 | Admitting: Certified Nurse Midwife

## 2019-05-30 ENCOUNTER — Other Ambulatory Visit (HOSPITAL_COMMUNITY)
Admission: RE | Admit: 2019-05-30 | Discharge: 2019-05-30 | Disposition: A | Discharge status: Home / Self Care | Payer: 59 | Source: Ambulatory Visit | Attending: Obstetrics & Gynecology | Admitting: Obstetrics & Gynecology

## 2019-05-30 ENCOUNTER — Other Ambulatory Visit: Payer: Self-pay

## 2019-05-30 ENCOUNTER — Encounter: Payer: Self-pay | Admitting: Certified Nurse Midwife

## 2019-05-30 VITALS — BP 120/72 | HR 70 | Temp 97.3°F | Resp 16 | Ht 67.75 in | Wt 188.0 lb

## 2019-05-30 DIAGNOSIS — N898 Other specified noninflammatory disorders of vagina: Secondary | ICD-10-CM

## 2019-05-30 DIAGNOSIS — Z124 Encounter for screening for malignant neoplasm of cervix: Secondary | ICD-10-CM | POA: Insufficient documentation

## 2019-05-30 DIAGNOSIS — R29890 Loss of height: Secondary | ICD-10-CM | POA: Diagnosis not present

## 2019-05-30 DIAGNOSIS — Z01419 Encounter for gynecological examination (general) (routine) without abnormal findings: Secondary | ICD-10-CM

## 2019-05-30 NOTE — Progress Notes (Signed)
58 y.o. G38P2002 Married  Caucasian Fe here for annual exam. Menopausal no HRT. Denies vaginal bleeding. Continues with Vagifem use prn for dryness, works well.. Sees Dr. Ardeth Perfect for aex, hypothyroid, Vitamin D and anxiety management. Still smoking, but has just joined cessation class with [facility] and plans to finish! Busy with family. Struggling with expenses at present, but has support as needed. No other health issues today.  Patient's last menstrual period was 11/24/2002.          Sexually active: Yes.    The current method of family planning is status post hysterectomy.    Exercising: Yes.    some Smoker:  yes  Review of Systems  Constitutional: Negative.   HENT: Negative.   Eyes: Negative.   Respiratory: Negative.   Cardiovascular: Negative.   Gastrointestinal: Negative.   Genitourinary: Negative.   Musculoskeletal: Negative.   Skin: Negative.   Neurological: Negative.   Endo/Heme/Allergies: Negative.   Psychiatric/Behavioral: Negative.     Health Maintenance: Pap:  01-24-16 neg History of Abnormal Pap: no MMG:  09-13-18 category c density birads 1:neg Self Breast exams: occ Colonoscopy:  2019 f/u 21yrs BMD:   2009 normal agreeable to  BMD TDaP:  2013 Shingles: not done Pneumonia: 2014 Hep C and HIV: hep c neg 2016, HIV neg during pregnancy Labs: PCP   reports that she has been smoking cigarettes. She has been smoking about 1.00 pack per day. She has never used smokeless tobacco. She reports that she does not drink alcohol or use drugs.  Past Medical History:  Diagnosis Date  . Allergy   . Anal fissure   . Aneurysm of ascending aorta (HCC) 12/14/2018   4.0 cm 07/2018.  Marland Kitchen Anxiety   . Arthritis   . Asthma   . Bladder cancer ([place])   . Claudication ([place]) 12/14/2018  . Colon polyps    TUBULAR ADENOMA   . Coronary artery calcification seen on CAT scan 12/14/2018  . Depression   . Diverticulitis   . Diverticulosis   . Endometriosis   . Enlarged liver   . Fatty liver   .  Fibroid   . Genital warts   . GERD (gastroesophageal reflux disease)   . Hemorrhoids   . Hepatomegaly   . Hiatal hernia   . Pneumonia   . Thyroid disease   . UTI (lower urinary tract infection)   . Varicose veins of both lower extremities     Past Surgical History:  Procedure Laterality Date  . BLADDER SURGERY    . BRONCHOSCOPY    . CESAREAN SECTION    . MICROLARYNGOSCOPY N/A 03/26/2018   Procedure: MICROLARYNGOSCOPY WITH VOCAL CORD BIOPSY;  Surgeon: Melida Quitter, MD;  Location: [facility];  Service: ENT;  Laterality: N/A;  . SPINE SURGERY    . TONSILLECTOMY    . VAGINAL HYSTERECTOMY  2004    Current Outpatient Medications  Medication Sig Dispense Refill  . Betamethasone Valerate 0.12 % foam as needed.    . calcipotriene (DOVONOX) 0.005 % cream     . calcipotriene-betamethasone (TACLONEX) ointment as needed.    . cetirizine (ZYRTEC) 10 MG tablet Take 10 mg by mouth daily.    . Cholecalciferol (VITAMIN D-3 PO) Take 1 tablet by mouth daily.    . Clobetasol Propionate Emulsion 0.05 % topical foam     . clonazePAM (KLONOPIN) 1 MG tablet as directed. 1 to 1 and 1/2 daily  0  . Cyanocobalamin (VITAMIN B-12 CR PO) Take 1 tablet by  mouth daily.    . Estradiol (VAGIFEM) 10 MCG TABS vaginal tablet Place 1 tablet (10 mcg total) vaginally 2 (two) times a week. 24 tablet 4  . levothyroxine (SYNTHROID, LEVOTHROID) 88 MCG tablet Take 88 mcg by mouth daily before breakfast.    . liothyronine (CYTOMEL) 5 MCG tablet Take 10 mcg by mouth daily.  0  . methocarbamol (ROBAXIN) 500 MG tablet TK 1 T PO BID PRF MUSCLE PAIN    . Naphazoline-Pheniramine (OPCON-A OP) Apply to eye daily.    Marland Kitchen zolpidem (AMBIEN) 10 MG tablet as needed. Reported on 02/12/2016  0   No current facility-administered medications for this visit.     Family History  Problem Relation Age of Onset  . Breast cancer Maternal Grandmother   . Stroke Maternal Grandmother   . Dementia Maternal Grandmother   .  Peripheral Artery Disease Maternal Grandmother   . Colon cancer Mother   . Cancer Mother        liver  . Liver cancer Mother   . Hypertension Father   . Thyroid disease Father   . Heart disease Father   . Alcohol abuse Father   . Heart attack Maternal Grandfather   . Dementia Paternal Grandmother   . Liver cancer Paternal Uncle   . Esophageal cancer Neg Hx   . Pancreatic cancer Neg Hx   . Rectal cancer Neg Hx   . Stomach cancer Neg Hx     ROS:  Pertinent items are noted in HPI.  Otherwise, a comprehensive ROS was negative.  Exam:   LMP 11/24/2002    Ht Readings from Last 3 Encounters:  03/21/19 5\' 9"  (1.753 m)  12/14/18 5\' 9"  (1.753 m)  03/26/18 5' 8.5" (1.74 m)    General appearance: alert, cooperative and appears stated age Head: Normocephalic, without obvious abnormality, atraumatic Neck: no adenopathy, supple, symmetrical, trachea midline and thyroid enlarged known Lungs: clear to auscultation bilaterally Breasts: normal appearance, no masses or tenderness, No nipple retraction or dimpling, No nipple discharge or bleeding, No axillary or supraclavicular adenopathy Heart: regular rate and rhythm Abdomen: soft, non-tender; no masses,  no organomegaly Extremities: extremities normal, atraumatic, no cyanosis or edema Skin: Skin color, texture, turgor normal. No rashes or lesions Lymph nodes: Cervical, supraclavicular, and axillary nodes normal. No abnormal inguinal nodes palpated Neurologic: Grossly normal   Pelvic: External genitalia:  no lesions              Urethra:  normal appearing urethra with no masses, tenderness or lesions              Bartholin's and Skene's: normal                 Vagina: normal appearing vagina with normal color and discharge, no lesions, small area on right increased redness, no exudate, slightly tender pap taken, ? Atrophic changes, affirm taken              Cervix: absent              Pap taken: Yes.   Bimanual Exam:  Uterus:  uterus  absent              Adnexa: normal adnexa and no mass, fullness, tenderness               Rectovaginal: Confirms               Anus:  normal sphincter tone, no lesions  Chaperone present: yes  A:  Well  Woman with normal exam  Menopausal no HRT  Vaginal dryness with Vagifem use  Area of change in vagina ? Atrophic changes  Hypothyroid with PCP management  Height loss  P:   Reviewed health and wellness pertinent to exam  Discussed risks/benefits of Vagifem use desires continuance.  Rx Vagifem see order with instructions  Discussed will wait on results to see if any change in treatment  Lab: affirm  Continue follow up with PCP  Discussed BMD for evaluation agreeable. Order placed and patient will call for appointment  Pap smear: yes   counseled on breast self exam, mammography screening, feminine hygiene, adequate intake of calcium and vitamin D, diet and exercise  return annually or prn  An After Visit Summary was printed and given to the patient.

## 2019-05-30 NOTE — Patient Instructions (Signed)

## 2019-05-31 LAB — VAGINITIS/VAGINOSIS, DNA PROBE
Candida Species: NEGATIVE
Gardnerella vaginalis: NEGATIVE
Trichomonas vaginosis: NEGATIVE

## 2019-06-01 ENCOUNTER — Telehealth: Payer: Self-pay

## 2019-06-01 LAB — CYTOLOGY - PAP
Diagnosis: NEGATIVE
HPV: NOT DETECTED

## 2019-06-01 NOTE — Telephone Encounter (Signed)
Left message for call back.

## 2019-06-01 NOTE — Telephone Encounter (Signed)
-----   Message from Regina Eck, CNM sent at 06/01/2019 12:47 PM EDT ----- Notify patient vaginal screening was negative for yeast, BV and trichomonas. No infection noted Pap smear negative HPV not detected 02

## 2019-06-02 NOTE — Telephone Encounter (Signed)
Patient returned call

## 2019-06-02 NOTE — Telephone Encounter (Signed)
Left message to call Mikki Ziff, CMA. °

## 2019-06-02 NOTE — Telephone Encounter (Signed)
Patient left message on answering machine returning call.

## 2019-06-02 NOTE — Telephone Encounter (Signed)
Spoke with patient and reviewed negative vaginitis testing and normal pap:neg HR HPV results. Patient wants to know if okay to restart vagifem--advised okay to resume.   Routed to provider to sign and close.

## 2019-06-03 NOTE — Telephone Encounter (Signed)
Patient notified of results. See lab 

## 2019-06-08 ENCOUNTER — Other Ambulatory Visit: Payer: Self-pay | Admitting: Certified Nurse Midwife

## 2019-06-08 DIAGNOSIS — R29890 Loss of height: Secondary | ICD-10-CM

## 2019-06-08 DIAGNOSIS — Z780 Asymptomatic menopausal state: Secondary | ICD-10-CM

## 2019-06-09 ENCOUNTER — Other Ambulatory Visit: Payer: Self-pay | Admitting: Certified Nurse Midwife

## 2019-06-09 DIAGNOSIS — R29890 Loss of height: Secondary | ICD-10-CM

## 2019-06-09 DIAGNOSIS — Z780 Asymptomatic menopausal state: Secondary | ICD-10-CM

## 2019-06-20 DIAGNOSIS — I712 Thoracic aortic aneurysm, without rupture: Secondary | ICD-10-CM | POA: Diagnosis not present

## 2019-06-20 DIAGNOSIS — Z8551 Personal history of malignant neoplasm of bladder: Secondary | ICD-10-CM | POA: Diagnosis not present

## 2019-06-20 DIAGNOSIS — E559 Vitamin D deficiency, unspecified: Secondary | ICD-10-CM | POA: Diagnosis not present

## 2019-06-20 DIAGNOSIS — I2510 Atherosclerotic heart disease of native coronary artery without angina pectoris: Secondary | ICD-10-CM | POA: Diagnosis not present

## 2019-06-20 DIAGNOSIS — F419 Anxiety disorder, unspecified: Secondary | ICD-10-CM | POA: Diagnosis not present

## 2019-06-20 DIAGNOSIS — E039 Hypothyroidism, unspecified: Secondary | ICD-10-CM | POA: Diagnosis not present

## 2019-06-20 DIAGNOSIS — L409 Psoriasis, unspecified: Secondary | ICD-10-CM | POA: Diagnosis not present

## 2019-06-20 DIAGNOSIS — J984 Other disorders of lung: Secondary | ICD-10-CM | POA: Diagnosis not present

## 2019-06-20 DIAGNOSIS — J449 Chronic obstructive pulmonary disease, unspecified: Secondary | ICD-10-CM | POA: Diagnosis not present

## 2019-06-20 MED FILL — MUPIROCIN 2% OINTMENT: 2 | 10 days supply | Qty: 44 | Fill #0

## 2019-06-21 MED FILL — LEVOTHYROXINE 88 MCG TABLET: 88 | 30 days supply | Qty: 30 | Fill #3

## 2019-07-06 DIAGNOSIS — C670 Malignant neoplasm of trigone of bladder: Secondary | ICD-10-CM | POA: Diagnosis not present

## 2019-07-06 DIAGNOSIS — N3941 Urge incontinence: Secondary | ICD-10-CM | POA: Diagnosis not present

## 2019-07-07 ENCOUNTER — Other Ambulatory Visit: Payer: Self-pay | Admitting: Urology

## 2019-07-12 MED FILL — METHOCARBAMOL 500 MG TABS: 500 | 20 days supply | Qty: 20 | Fill #0

## 2019-07-13 MED FILL — ONDANSETRON HCL 4 MG TABLET: 4 | 30 days supply | Qty: 30 | Fill #0

## 2019-07-13 MED FILL — ZOLPIDEM TARTRATE 10 MG TAB: 10 | 90 days supply | Qty: 90 | Fill #0

## 2019-07-14 ENCOUNTER — Other Ambulatory Visit: Payer: Self-pay | Admitting: Internal Medicine

## 2019-07-14 DIAGNOSIS — R911 Solitary pulmonary nodule: Secondary | ICD-10-CM

## 2019-07-15 MED FILL — LEVOTHYROXINE 88 MCG TABLET: 88 | 30 days supply | Qty: 30 | Fill #0

## 2019-07-18 ENCOUNTER — Other Ambulatory Visit (HOSPITAL_COMMUNITY)
Admission: RE | Admit: 2019-07-18 | Discharge: 2019-07-18 | Disposition: A | Discharge status: Home / Self Care | Payer: 59 | Source: Ambulatory Visit | Attending: Urology | Admitting: Urology

## 2019-07-18 DIAGNOSIS — Z01812 Encounter for preprocedural laboratory examination: Secondary | ICD-10-CM | POA: Diagnosis not present

## 2019-07-18 DIAGNOSIS — Z20828 Contact with and (suspected) exposure to other viral communicable diseases: Secondary | ICD-10-CM | POA: Diagnosis not present

## 2019-07-19 LAB — SARS CORONAVIRUS 2 (TAT 6-24 HRS): SARS Coronavirus 2: NEGATIVE

## 2019-07-19 MED FILL — clonazePAM 1 MG TABS: 1 | 60 days supply | Qty: 90 | Fill #0

## 2019-07-20 ENCOUNTER — Encounter (HOSPITAL_BASED_OUTPATIENT_CLINIC_OR_DEPARTMENT_OTHER): Payer: Self-pay | Admitting: *Deleted

## 2019-07-20 ENCOUNTER — Other Ambulatory Visit: Payer: Self-pay

## 2019-07-20 NOTE — Progress Notes (Signed)
Spoke w/ pt via phone for pre-op interview. Npo after mn.  Arrive at 0700.  Pt had covid test done 07-18-2019.  Current ekg in chart and epic.  Will take synthroid and cytomel am dos w/ sips of water.  Pt denies cardiac s&s and no peripheral swelling.      PCP -   dr Velna Hatchet Cardiologist -  dr Skeet Latch  Chest x-ray -   CT chest 08-04-2018 epic  EKG -   12-14-2018 epic Stress Test -   ETT 12-29-2018 epic ECHO -   04-26-2013  epic Cardiac Cath -  no  Sleep Study -  no CPAP -  no  Fasting Blood Sugar -   N/A Checks Blood Sugar _____ times a day  Blood Thinner Instructions:  N/A Aspirin Instructions:   81 mg,  instructions given by dr Jeffie Pollock office Last Dose:   07-16-2019  Anesthesia review:   hx AAA,  stable at 4.0cm.   lung nodules stable.  Pt smoker, trying to quit.  Patient denies shortness of breath, fever, cough and chest pain at phone interview.

## 2019-07-20 NOTE — H&P (Signed)
CC: I have bladder cancer.  HPI: Felicia Leonard is a 58 year-old female established patient who is here for bladder cancer.  Her problem was diagnosed approximately 10/24/2001.   Her bladder cancer was treated by removal with scope. Patient denies removal of the entire bladder, radiation, and chemotherapy.   Her last cysto was 11/10/2016.   She does have a good appetite. BOWEL HABITS: her bowels are not moving normally. She is not having pain in new locations. She has not recently had unwanted weight loss.   Felicia Leonard returns today in f/u. She has a remote history of bladder cancer with no recurrence in over 8 years. She has had no hematuria but she has stable frequency with urgency and UUI generally in the AM with coffee. She isn't wearing pads. She has a history of a right renal duplication. She has no SUI. She has no associated signs or symptoms.       ALLERGIES: Augmentin TABS Chantix TABS Penicillins Prevacid Sulfa Drugs    MEDICATIONS: Ambien 10 mg tablet Oral  Aspirin Ec 81 mg tablet, delayed release  ClonazePAM 0.5 MG Oral Tablet Oral  Cytomel 25 mcg tablet Oral  Synthroid 88 mcg tablet Oral  Vitamin B-12 TABS Oral  Vitamin D3 CAPS Oral  Yuvafem  Zantac 300 mg tablet Oral  Zyrtec TABS Oral     GU PSH: Biopsy Skin Lesion - 2015 Cystoscopy - 01/20/2018, 2017 Hysterectomy Unilat SO - 2014       Felicia Leonard Notes: Colonoscopy (Fiberoptic), Biopsy Skin, Bladder Surgery, Cesarean Section, Hysterectomy, Back Surgery   NON-GU PSH: Cesarean Delivery Only - 2014 Diagnostic Colonoscopy - 2015     GU PMH: Urge incontinence (Worsening), She has had increased urgency and UUI over the last few months. - 2017 Gross hematuria, Gross hematuria - 2016 History of bladder cancer, Personal history of bladder cancer - 2016 Urinary Frequency, Urinary frequency - 2016 Urinary Urgency, Urinary urgency - 2016      PMH Notes:  2013-02-23 14:43:23 - Note: Arthritis   NON-GU PMH: Encounter  for general adult medical examination without abnormal findings, Encounter for preventive health examination - 2016 Anxiety, Anxiety (Symptom) - 2014 Personal history of diseases of the skin and subcutaneous tissue, History of psoriasis - 2014 Personal history of other diseases of the digestive system, History of esophageal reflux - 2014 Personal history of other endocrine, nutritional and metabolic disease, History of hypothyroidism - 2014 Solitary pulmonary nodule, CXR Lungs Solitary Pulmonary Nodule (___ Cm) - 2014    FAMILY HISTORY: Breast Cancer - Grandmother Colon Cancer - Mother Death In The Family Mother - Runs In Family Family Health Status Number - Runs In Family Family Health Status Of Father - Alive - Runs In Family liver cancer - Mother nephrolithiasis - Runs In Family   SOCIAL HISTORY: None    Notes: Current every day smoker, Alcohol Use, Caffeine Use, Marital History - Currently Married   REVIEW OF SYSTEMS:    GU Review Female:   Patient reports frequent urination. Patient denies hard to postpone urination, burning /pain with urination, get up at night to urinate, leakage of urine, stream starts and stops, trouble starting your stream, have to strain to urinate, and being pregnant.  Gastrointestinal (Upper):   Patient denies nausea, vomiting, and indigestion/ heartburn.  Gastrointestinal (Lower):   Patient denies diarrhea and constipation.  Constitutional:   Patient denies fever, night sweats, weight loss, and fatigue.  Skin:   Patient denies skin rash/ lesion and itching.  Eyes:  Patient denies blurred vision and double vision.  Ears/ Nose/ Throat:   Patient denies sore throat and sinus problems.  Hematologic/Lymphatic:   Patient denies swollen glands and easy bruising.  Cardiovascular:   Patient denies leg swelling and chest pains.  Respiratory:   Patient denies cough and shortness of breath.  Endocrine:   Patient denies excessive thirst.  Musculoskeletal:   Patient  denies back pain and joint pain.  Neurological:   Patient denies headaches and dizziness.  Psychologic:   Patient denies depression and anxiety.   VITAL SIGNS:      07/06/2019 08:25 AM  Weight 188 lb / 85.28 kg  Height 67 in / 170.18 cm  BP 153/77 mmHg  Pulse 91 /min  Temperature 97.3 F / 36.2 C  BMI 29.4 kg/m   MULTI-SYSTEM PHYSICAL EXAMINATION:    Constitutional: Well-nourished. No physical deformities. Normally developed. Good grooming.  Respiratory: Normal breath sounds. No labored breathing, no use of accessory muscles.   Cardiovascular: Regular rate and rhythm. No murmur, no gallop.      PAST DATA REVIEWED:  Source Of History:  Patient  Urine Test Review:   Urinalysis   PROCEDURES:         Flexible Cystoscopy - 52000  Risks, benefits, and some of the potential complications of the procedure were discussed. She was prepped with betadine.   Meatus:  Normal size. Normal location. Normal condition.  Urethra:  No hypermobility. No leakage.  Ureteral Orifices:  Normal location. Normal size. Normal shape. Effluxed clear urine.  Bladder:  Mild trabeculation. A right trigone tumor. < 1/2 cm tumor. Normal mucosa. No stones.      The procedure was well tolerated and there were no complications.         Urinalysis Dipstick Dipstick Cont'd  Color: Yellow Bilirubin: Neg mg/dL  Appearance: Clear Ketones: Neg mg/dL  Specific Gravity: <=1.005 Blood: Neg ery/uL  pH: 6.5 Protein: Neg mg/dL  Glucose: Neg mg/dL Urobilinogen: 0.2 mg/dL    Nitrites: Neg    Leukocyte Esterase: Neg leu/uL    ASSESSMENT:      ICD-10 Details  1 GU:   Bladder Cancer Trigone - C67.0 She has a small recurrence at the right trigone. She needs cystoscopy with Bil RTG's, biopsy and fulguration with gemcitabine. Risks of bleeding, infection, bladder and ureteral injury, chemical cystitis, thrombotic events and anesthetic complications reviewed.   2   Urge incontinence - N39.41 Stable   PLAN:            Orders Labs Urine Cytology          Schedule Return Visit/Planned Activity: Next Available Appointment - Schedule Surgery             Note: cysto bladder biopsy.          Document Letter(s):  Created for Patient: Clinical Summary         Notes:   She has a right renal duplication.   CC: Dr. Velna Hatchet.

## 2019-07-21 ENCOUNTER — Other Ambulatory Visit: Payer: Self-pay

## 2019-07-21 ENCOUNTER — Encounter (HOSPITAL_BASED_OUTPATIENT_CLINIC_OR_DEPARTMENT_OTHER): Payer: Self-pay | Admitting: *Deleted

## 2019-07-21 ENCOUNTER — Ambulatory Visit (HOSPITAL_BASED_OUTPATIENT_CLINIC_OR_DEPARTMENT_OTHER): Payer: 59 | Admitting: Anesthesiology

## 2019-07-21 ENCOUNTER — Ambulatory Visit (HOSPITAL_BASED_OUTPATIENT_CLINIC_OR_DEPARTMENT_OTHER)
Admission: RE | Admit: 2019-07-21 | Discharge: 2019-07-21 | Disposition: A | Discharge status: Home / Self Care | Payer: 59 | Attending: Urology | Admitting: Urology

## 2019-07-21 ENCOUNTER — Encounter (HOSPITAL_BASED_OUTPATIENT_CLINIC_OR_DEPARTMENT_OTHER)
Admission: RE | Disposition: A | Discharge status: Home / Self Care | Payer: Self-pay | Source: Home / Self Care | Attending: Urology

## 2019-07-21 DIAGNOSIS — K219 Gastro-esophageal reflux disease without esophagitis: Secondary | ICD-10-CM | POA: Insufficient documentation

## 2019-07-21 DIAGNOSIS — F418 Other specified anxiety disorders: Secondary | ICD-10-CM | POA: Diagnosis not present

## 2019-07-21 DIAGNOSIS — D090 Carcinoma in situ of bladder: Secondary | ICD-10-CM | POA: Diagnosis not present

## 2019-07-21 DIAGNOSIS — F329 Major depressive disorder, single episode, unspecified: Secondary | ICD-10-CM | POA: Insufficient documentation

## 2019-07-21 DIAGNOSIS — C670 Malignant neoplasm of trigone of bladder: Secondary | ICD-10-CM | POA: Diagnosis not present

## 2019-07-21 DIAGNOSIS — C672 Malignant neoplasm of lateral wall of bladder: Secondary | ICD-10-CM | POA: Diagnosis not present

## 2019-07-21 DIAGNOSIS — N3289 Other specified disorders of bladder: Secondary | ICD-10-CM | POA: Diagnosis not present

## 2019-07-21 DIAGNOSIS — Z79899 Other long term (current) drug therapy: Secondary | ICD-10-CM | POA: Diagnosis not present

## 2019-07-21 DIAGNOSIS — R911 Solitary pulmonary nodule: Secondary | ICD-10-CM | POA: Insufficient documentation

## 2019-07-21 DIAGNOSIS — F17210 Nicotine dependence, cigarettes, uncomplicated: Secondary | ICD-10-CM | POA: Diagnosis not present

## 2019-07-21 DIAGNOSIS — F419 Anxiety disorder, unspecified: Secondary | ICD-10-CM | POA: Diagnosis not present

## 2019-07-21 DIAGNOSIS — E039 Hypothyroidism, unspecified: Secondary | ICD-10-CM | POA: Insufficient documentation

## 2019-07-21 DIAGNOSIS — C678 Malignant neoplasm of overlapping sites of bladder: Secondary | ICD-10-CM | POA: Diagnosis not present

## 2019-07-21 HISTORY — PX: CYSTOSCOPY W/ RETROGRADES: SHX1426

## 2019-07-21 HISTORY — DX: Vitamin D deficiency, unspecified: E55.9

## 2019-07-21 HISTORY — DX: Personal history of other diseases of the female genital tract: Z87.42

## 2019-07-21 HISTORY — DX: Hypothyroidism, unspecified: E03.9

## 2019-07-21 HISTORY — DX: Psoriasis, unspecified: L40.9

## 2019-07-21 HISTORY — DX: Personal history of colonic polyps: Z86.010

## 2019-07-21 HISTORY — DX: Personal history of urinary (tract) infections: Z87.440

## 2019-07-21 HISTORY — DX: Diverticulosis of large intestine without perforation or abscess without bleeding: K57.30

## 2019-07-21 HISTORY — DX: Personal history of other medical treatment: Z92.89

## 2019-07-21 HISTORY — DX: Arthropathic psoriasis, unspecified: L40.50

## 2019-07-21 HISTORY — PX: CYSTOSCOPY WITH BIOPSY: SHX5122

## 2019-07-21 HISTORY — DX: Personal history of colon polyps, unspecified: Z86.0100

## 2019-07-21 HISTORY — DX: Personal history of other infectious and parasitic diseases: Z86.19

## 2019-07-21 HISTORY — DX: Personal history of other diseases of the digestive system: Z87.19

## 2019-07-21 HISTORY — DX: Simple chronic bronchitis: J41.0

## 2019-07-21 HISTORY — DX: Chronic obstructive pulmonary disease, unspecified: J44.9

## 2019-07-21 SURGERY — CYSTOSCOPY, WITH BIOPSY
Anesthesia: General | Site: Renal

## 2019-07-21 MED ORDER — ONDANSETRON HCL 4 MG/2ML IJ SOLN
4.0000 mg | Freq: Once | INTRAMUSCULAR | Status: DC | PRN
  Filled 2019-07-21: qty 2

## 2019-07-21 MED ORDER — OXYCODONE HCL 5 MG PO TABS
5.0000 mg | ORAL_TABLET | ORAL | Status: DC | PRN
  Filled 2019-07-21: qty 2

## 2019-07-21 MED ORDER — ACETAMINOPHEN 325 MG PO TABS
325.0000 mg | ORAL_TABLET | ORAL | Status: DC | PRN
  Filled 2019-07-21: qty 2

## 2019-07-21 MED ORDER — CIPROFLOXACIN IN D5W 400 MG/200ML IV SOLN
400.0000 mg | INTRAVENOUS | Status: AC
  Administered 2019-07-21: 09:00:00 400 mg via INTRAVENOUS
  Filled 2019-07-21: qty 200

## 2019-07-21 MED ORDER — SODIUM CHLORIDE 0.9% FLUSH
3.0000 mL | Freq: Two times a day (BID) | INTRAVENOUS | Status: DC
  Filled 2019-07-21: qty 3

## 2019-07-21 MED ORDER — FENTANYL CITRATE (PF) 100 MCG/2ML IJ SOLN
INTRAMUSCULAR | Status: DC | PRN
  Administered 2019-07-21: 25 ug via INTRAVENOUS
  Administered 2019-07-21: 50 ug via INTRAVENOUS
  Administered 2019-07-21: 25 ug via INTRAVENOUS

## 2019-07-21 MED ORDER — ACETAMINOPHEN 160 MG/5ML PO SOLN
325.0000 mg | ORAL | Status: DC | PRN
  Filled 2019-07-21: qty 20.3

## 2019-07-21 MED ORDER — ACETAMINOPHEN 325 MG PO TABS
650.0000 mg | ORAL_TABLET | ORAL | Status: DC | PRN
  Filled 2019-07-21: qty 2

## 2019-07-21 MED ORDER — LIDOCAINE 2% (20 MG/ML) 5 ML SYRINGE
INTRAMUSCULAR | Status: AC
  Filled 2019-07-21: qty 5

## 2019-07-21 MED ORDER — PROPOFOL 10 MG/ML IV BOLUS
INTRAVENOUS | Status: AC
  Filled 2019-07-21: qty 20

## 2019-07-21 MED ORDER — ACETAMINOPHEN 650 MG RE SUPP
650.0000 mg | RECTAL | Status: DC | PRN
  Filled 2019-07-21: qty 1

## 2019-07-21 MED ORDER — OXYCODONE HCL 5 MG PO TABS
5.0000 mg | ORAL_TABLET | Freq: Once | ORAL | Status: DC | PRN
  Filled 2019-07-21: qty 1

## 2019-07-21 MED ORDER — LIDOCAINE 2% (20 MG/ML) 5 ML SYRINGE
INTRAMUSCULAR | Status: DC | PRN
  Administered 2019-07-21: 80 mg via INTRAVENOUS

## 2019-07-21 MED ORDER — FENTANYL CITRATE (PF) 100 MCG/2ML IJ SOLN
INTRAMUSCULAR | Status: AC
  Filled 2019-07-21: qty 2

## 2019-07-21 MED ORDER — PROPOFOL 10 MG/ML IV BOLUS
INTRAVENOUS | Status: DC | PRN
  Administered 2019-07-21: 200 mg via INTRAVENOUS

## 2019-07-21 MED ORDER — DEXAMETHASONE SODIUM PHOSPHATE 10 MG/ML IJ SOLN
INTRAMUSCULAR | Status: DC | PRN
  Administered 2019-07-21: 10 mg via INTRAVENOUS

## 2019-07-21 MED ORDER — SODIUM CHLORIDE 0.9% FLUSH
3.0000 mL | INTRAVENOUS | Status: DC | PRN
  Filled 2019-07-21: qty 3

## 2019-07-21 MED ORDER — LACTATED RINGERS IV SOLN
INTRAVENOUS | Status: DC
  Administered 2019-07-21 (×2): via INTRAVENOUS
  Filled 2019-07-21: qty 1000

## 2019-07-21 MED ORDER — FENTANYL CITRATE (PF) 100 MCG/2ML IJ SOLN
25.0000 ug | INTRAMUSCULAR | Status: DC | PRN
  Administered 2019-07-21: 25 ug via INTRAVENOUS
  Administered 2019-07-21: 10:00:00 via INTRAVENOUS
  Filled 2019-07-21: qty 1

## 2019-07-21 MED ORDER — STERILE WATER FOR IRRIGATION IR SOLN
Status: DC | PRN
  Administered 2019-07-21: 3000 mL

## 2019-07-21 MED ORDER — IOHEXOL 300 MG/ML  SOLN
INTRAMUSCULAR | Status: DC | PRN
  Administered 2019-07-21: 10 mL

## 2019-07-21 MED ORDER — CIPROFLOXACIN IN D5W 400 MG/200ML IV SOLN
INTRAVENOUS | Status: AC
  Filled 2019-07-21: qty 200

## 2019-07-21 MED ORDER — ONDANSETRON HCL 4 MG/2ML IJ SOLN
INTRAMUSCULAR | Status: AC
  Filled 2019-07-21: qty 2

## 2019-07-21 MED ORDER — MIDAZOLAM HCL 2 MG/2ML IJ SOLN
INTRAMUSCULAR | Status: AC
  Filled 2019-07-21: qty 2

## 2019-07-21 MED ORDER — OXYCODONE HCL 5 MG/5ML PO SOLN
5.0000 mg | Freq: Once | ORAL | Status: DC | PRN
  Filled 2019-07-21: qty 5

## 2019-07-21 MED ORDER — GEMCITABINE CHEMO FOR BLADDER INSTILLATION 2000 MG
2000.0000 mg | Freq: Once | INTRAVENOUS | Status: AC
  Administered 2019-07-21: 2000 mg via INTRAVESICAL
  Filled 2019-07-21: qty 2000

## 2019-07-21 MED ORDER — DEXAMETHASONE SODIUM PHOSPHATE 10 MG/ML IJ SOLN
INTRAMUSCULAR | Status: AC
  Filled 2019-07-21: qty 1

## 2019-07-21 MED ORDER — MIDAZOLAM HCL 5 MG/5ML IJ SOLN
INTRAMUSCULAR | Status: DC | PRN
  Administered 2019-07-21: 2 mg via INTRAVENOUS

## 2019-07-21 MED ORDER — SODIUM CHLORIDE 0.9 % IV SOLN
250.0000 mL | INTRAVENOUS | Status: DC | PRN
  Filled 2019-07-21: qty 250

## 2019-07-21 MED ORDER — MORPHINE SULFATE (PF) 2 MG/ML IV SOLN
2.0000 mg | INTRAVENOUS | Status: DC | PRN
  Filled 2019-07-21: qty 1

## 2019-07-21 MED ORDER — MEPERIDINE HCL 25 MG/ML IJ SOLN
6.2500 mg | INTRAMUSCULAR | Status: DC | PRN
  Filled 2019-07-21: qty 1

## 2019-07-21 MED ORDER — ONDANSETRON HCL 4 MG/2ML IJ SOLN
INTRAMUSCULAR | Status: DC | PRN
  Administered 2019-07-21: 4 mg via INTRAVENOUS

## 2019-07-21 MED ORDER — HYDROCODONE-ACETAMINOPHEN 5-325 MG PO TABS: 1.0000 | ORAL_TABLET | Freq: Four times a day (QID) | ORAL | 0 refills | Status: DC | PRN

## 2019-07-21 MED FILL — HYDROCODON-APAP 5-325: 5-325 | 1 days supply | Qty: 4 | Fill #0

## 2019-07-21 SURGICAL SUPPLY — 36 items
BAG DRAIN URO-CYSTO SKYTR STRL (DRAIN) ×3 IMPLANT
BAG URINE DRAINAGE (UROLOGICAL SUPPLIES) ×1 IMPLANT
BASKET STONE 1.7 NGAGE (UROLOGICAL SUPPLIES) IMPLANT
BASKET ZERO TIP NITINOL 2.4FR (BASKET) IMPLANT
CATH FOLEY 2WAY SLVR  5CC 16FR (CATHETERS) ×1
CATH FOLEY 2WAY SLVR 5CC 16FR (CATHETERS) IMPLANT
CATH URET 5FR 28IN CONE TIP (BALLOONS)
CATH URET 5FR 28IN OPEN ENDED (CATHETERS) ×1 IMPLANT
CATH URET 5FR 70CM CONE TIP (BALLOONS) IMPLANT
CLOTH BEACON ORANGE TIMEOUT ST (SAFETY) ×3 IMPLANT
DRSG TELFA 3X8 NADH (GAUZE/BANDAGES/DRESSINGS) ×3 IMPLANT
ELECT REM PT RETURN 9FT ADLT (ELECTROSURGICAL) ×3
ELECTRODE REM PT RTRN 9FT ADLT (ELECTROSURGICAL) ×2 IMPLANT
FIBER LASER FLEXIVA 365 (UROLOGICAL SUPPLIES) IMPLANT
FIBER LASER TRAC TIP (UROLOGICAL SUPPLIES) IMPLANT
GLOVE SURG SS PI 8.0 STRL IVOR (GLOVE) ×3 IMPLANT
GOWN STRL REUS W/ TWL LRG LVL3 (GOWN DISPOSABLE) ×2 IMPLANT
GOWN STRL REUS W/ TWL XL LVL3 (GOWN DISPOSABLE) ×2 IMPLANT
GOWN STRL REUS W/TWL LRG LVL3 (GOWN DISPOSABLE) ×1
GOWN STRL REUS W/TWL XL LVL3 (GOWN DISPOSABLE) ×4 IMPLANT
GUIDEWIRE ANG ZIPWIRE 038X150 (WIRE) IMPLANT
GUIDEWIRE STR DUAL SENSOR (WIRE) ×3 IMPLANT
HOLDER FOLEY CATH W/STRAP (MISCELLANEOUS) ×1 IMPLANT
IV NS IRRIG 3000ML ARTHROMATIC (IV SOLUTION) ×3 IMPLANT
KIT TURNOVER CYSTO (KITS) ×3 IMPLANT
MANIFOLD NEPTUNE II (INSTRUMENTS) ×3 IMPLANT
NDL SAFETY ECLIPSE 18X1.5 (NEEDLE) IMPLANT
NEEDLE HYPO 18GX1.5 SHARP (NEEDLE) ×1
NEEDLE HYPO 22GX1.5 SAFETY (NEEDLE) IMPLANT
NS IRRIG 500ML POUR BTL (IV SOLUTION) ×3 IMPLANT
PACK CYSTO (CUSTOM PROCEDURE TRAY) ×3 IMPLANT
PAD DRESSING TELFA 3X8 NADH (GAUZE/BANDAGES/DRESSINGS) IMPLANT
SYR 20CC LL (SYRINGE) IMPLANT
TUBE CONNECTING 12X1/4 (SUCTIONS) ×3 IMPLANT
TUBING UROLOGY SET (TUBING) IMPLANT
WATER STERILE IRR 3000ML UROMA (IV SOLUTION) ×3 IMPLANT

## 2019-07-21 NOTE — Op Note (Signed)
Procedure: 1.  Cystoscopy with bilateral retrograde pyelography and interpretation. 2.  Cystoscopy with biopsy and fulguration of lesions of the right lateral wall, right trigone and mid trigone. 3.  Instillation of gemcitabine in PACU.  Preop diagnosis: Recurrent urothelial carcinoma of the right lateral wall.  Postop diagnosis: Same with possible adjacent carcinoma in situ and a small lesion of the mid trigone.  Right renal duplication.  Surgeon: Dr. [name].  Anesthesia: General.  Specimen: Biopsies from the right lateral wall, lateral to the right ureteral orifice and mid trigone.  Drains: 19 French Foley catheter.   EBL: Minimal.  Complications: None.  Indications: Felicia Leonard is a 58 year old female with a history of urothelial carcinoma who was found on recent surveillance cystoscopy to have a small recurrence lateral to the right ureteral orifice on the lateral wall.  She has a known right renal duplication.  Procedure: She was given preoperative Cipro.  A general anesthetic was induced.  She was placed in lithotomy position and fitted with PAS hose.  Her perineum and genitalia were prepped with Betadine solution and she was draped in usual sterile fashion.  Cystoscopy was performed using a 23 [place] scope and 30 and 70 degree lenses.  Examination revealed a normal urethra.  The bladder wall had mild trabeculation.  There was a approximately 3 to 5 mm papillary lesion approximately 1-1/2 to 2 cm lateral to the right ureteral orifice on the lateral wall and medial to this lesion with some erythematous mucosa that is suggestive of carcinoma in situ.  An additional small lesion was noted in the mid trigone which is probably cystitis cystica but a small urothelial neoplasm cannot be ruled out with visual inspection.  No additional mucosal lesions are identified.  The left ureteral orifice was in its normal anatomic position effluxing clear urine.  There were 2 right ureteral orifices with  the lateralmost having a normal appearance and effluxing clear urine but the medial orifice appeared to have a small simple ureterocele but did effluxed clear urine.  Bilateral retrograde pyelography was performed with a 5 [place] open-ended catheter and Omnipaque.  The retrograde of the right lateral orifice demonstrated no filling defects of the lower pole no at the of the kidney and no obstruction.  Multiple attempts to cannulate the medial orifice with both the 30 and 70 degree lenses with the aid of the Albarans bridge were unsuccessful although I did visualize clear urine effluxing from the orifice..  The left retrograde pyelogram demonstrated a normal ureter and intrarenal collecting system.  After completion of the retrograde pyelograms a cup biopsy forceps was used and the lesion on the right lateral wall was biopsied with 2 bites and collected as a separate specimen and the erythematous mucosa between the lesion and the ureteral orifice was biopsied and sent as a separate specimen.  These biopsy sites were fulgurated with the diameter of the overlapping lesions being approximately 1.5 to 2 cm.  The mid trigone lesion was biopsied and the site was fulgurated as well.  A final attempt to cannulate the medial right ureteral orifice was performed but I was still unable to pass the 5 open-ended catheter or an associated guidewire with the Albarans bridge and 70 degree lens or the 30 degree lens.  I will consider a subsequent renal ultrasound to assess for upper pole obstruction at follow-up.  Once hemostasis was assured the bladder was drained and a 16 French Foley catheter was inserted.  The balloon was placed to straight drainage.  She was taken down from lithotomy position, her anesthetic was reversed and she was moved recovery in stable condition.  In the recovery room the bladder was instilled with 2000 mg of gemcitabine and 50 mL of diluent and this was left indwelling for 1 hour.  Her  bladder was then drained and the catheter was removed.  There were no complications.

## 2019-07-21 NOTE — Anesthesia Preprocedure Evaluation (Signed)
Anesthesia Evaluation  Patient identified by MRN, date of birth, ID band Patient awake    Reviewed: Allergy & Precautions, NPO status , Patient's Chart, lab work & pertinent test results  Airway Mallampati: II  TM Distance: >3 FB Neck ROM: Full    Dental  (+) Teeth Intact, Dental Advisory Given   Pulmonary Current Smoker,    breath sounds clear to auscultation       Cardiovascular  Rhythm:Regular Rate:Normal  Coronary artery calcification seen on CAT scan  cardiologist--  dr t. Oval Linsey History of exercise stress test  ETT 12-29-2018--- negative for ischemia    Neuro/Psych PSYCHIATRIC DISORDERS Anxiety Depression    GI/Hepatic hiatal hernia, GERD  ,  Endo/Other    Renal/GU      Musculoskeletal   Abdominal   Peds  Hematology   Anesthesia Other Findings   Reproductive/Obstetrics                             Anesthesia Physical  Anesthesia Plan  ASA: II  Anesthesia Plan: General   Post-op Pain Management:    Induction: Intravenous  PONV Risk Score and Plan: 1 and Ondansetron and Dexamethasone  Airway Management Planned: Oral ETT and LMA  Additional Equipment:   Intra-op Plan:   Post-operative Plan: Extubation in OR  Informed Consent: I have reviewed the patients History and Physical, chart, labs and discussed the procedure including the risks, benefits and alternatives for the proposed anesthesia with the patient or authorized representative who has indicated his/her understanding and acceptance.     Dental advisory given  Plan Discussed with: CRNA, Anesthesiologist and Surgeon  Anesthesia Plan Comments: ( )        Anesthesia Quick Evaluation

## 2019-07-21 NOTE — Anesthesia Procedure Notes (Signed)
Procedure Name: LMA Insertion Date/Time: 07/21/2019 8:50 AM Performed by: Genelle Bal, CRNA Pre-anesthesia Checklist: Patient identified, Emergency Drugs available, Suction available and Patient being monitored Patient Re-evaluated:Patient Re-evaluated prior to induction Oxygen Delivery Method: Circle system utilized Preoxygenation: Pre-oxygenation with 100% oxygen Induction Type: IV induction Ventilation: Mask ventilation without difficulty LMA: LMA inserted LMA Size: 4.0 Number of attempts: 1 Airway Equipment and Method: Bite block Placement Confirmation: positive ETCO2 Tube secured with: Tape Dental Injury: Teeth and Oropharynx as per pre-operative assessment

## 2019-07-21 NOTE — Interval H&P Note (Signed)
History and Physical Interval Note:  07/21/2019 8:34 AM  Felicia Leonard  has presented today for surgery, with the diagnosis of BLADDER TUMOR.  The various methods of treatment have been discussed with the patient and family. After consideration of risks, benefits and other options for treatment, the patient has consented to  Procedure(s): CYSTOSCOPY WITH BIOPSY FULGURATION INSTILL GEMCITABINE (N/A) CYSTOSCOPY WITH RETROGRADE PYELOGRAM (Bilateral) as a surgical intervention.  The patient's history has been reviewed, patient examined, no change in status, stable for surgery.  I have reviewed the patient's chart and labs.  Questions were answered to the patient's satisfaction.     [name]

## 2019-07-21 NOTE — Interval H&P Note (Signed)
History and Physical Interval Note:  07/21/2019 8:28 AM  Felicia Leonard  has presented today for surgery, with the diagnosis of BLADDER TUMOR.  The various methods of treatment have been discussed with the patient and family. After consideration of risks, benefits and other options for treatment, the patient has consented to  Procedure(s): CYSTOSCOPY WITH BIOPSY FULGURATION INSTILL GEMCITABINE (N/A) CYSTOSCOPY WITH RETROGRADE PYELOGRAM (Bilateral) as a surgical intervention.  The patient's history has been reviewed, patient examined, no change in status, stable for surgery.  I have reviewed the patient's chart and labs.  Questions were answered to the patient's satisfaction.     [name]

## 2019-07-21 NOTE — Transfer of Care (Signed)
Immediate Anesthesia Transfer of Care Note  Patient: Felicia Leonard  Procedure(s) Performed: CYSTOSCOPY WITH BIOPSY FULGURATION WITH INSTILL GEMCITABINE IN PACU (N/A Bladder) CYSTOSCOPY WITH RETROGRADE PYELOGRAM (Bilateral Renal)  Patient Location: PACU  Anesthesia Type:General  Level of Consciousness: awake, alert  and oriented  Airway & Oxygen Therapy: Patient Spontanous Breathing and Patient connected to face mask oxygen  Post-op Assessment: Report given to RN and Post -op Vital signs reviewed and stable  Post vital signs: Reviewed and stable  Last Vitals:  Vitals Value Taken Time  BP 120/86   Temp    Pulse 70 07/21/19 0944  Resp 15 07/21/19 0944  SpO2 100 % 07/21/19 0944  Vitals shown include unvalidated device data.  Last Pain:  Vitals:   07/21/19 0748  TempSrc:   PainSc: 1       Patients Stated Pain Goal: 4 ([id number])  Complications: No apparent anesthesia complications

## 2019-07-21 NOTE — Discharge Instructions (Signed)
CYSTOSCOPY HOME CARE INSTRUCTIONS  Activity: Rest for the remainder of the day.  Do not drive or operate equipment today.  You may resume normal activities in one to two days as instructed by your physician.   Meals: Drink plenty of liquids and eat light foods such as gelatin or soup this evening.  You may return to a normal meal plan tomorrow.  Return to Work: You may return to work in one to two days or as instructed by your physician.  Special Instructions / Symptoms: Call your physician if any of these symptoms occur:   -persistent or heavy bleeding  -bleeding which continues after first few urination  -large blood clots that are difficult to pass  -urine stream diminishes or stops completely  -fever equal to or higher than 101 degrees Farenheit.  -cloudy urine with a strong, foul odor  -severe pain  Females should always wipe from front to back after elimination.  You may feel some burning pain when you urinate.  This should disappear with time.  Applying moist heat to the lower abdomen or a hot tub bath may help relieve the pain. \     Post Anesthesia Home Care Instructions  Activity: Get plenty of rest for the remainder of the day. A responsible individual must stay with you for 24 hours following the procedure.  For the next 24 hours, DO NOT: -Drive a car -Paediatric nurse -Drink alcoholic beverages -Take any medication unless instructed by your physician -Make any legal decisions or sign important papers.  Meals: Start with liquid foods such as gelatin or soup. Progress to regular foods as tolerated. Avoid greasy, spicy, heavy foods. If nausea and/or vomiting occur, drink only clear liquids until the nausea and/or vomiting subsides. Call your physician if vomiting continues.  Special Instructions/Symptoms: Your throat may feel dry or sore from the anesthesia or the breathing tube placed in your throat during surgery. If this causes discomfort, gargle with warm salt  water. The discomfort should disappear within 24 hours.  If you had a scopolamine patch placed behind your ear for the management of post- operative nausea and/or vomiting:  1. The medication in the patch is effective for 72 hours, after which it should be removed.  Wrap patch in a tissue and discard in the trash. Wash hands thoroughly with soap and water. 2. You may remove the patch earlier than 72 hours if you experience unpleasant side effects which may include dry mouth, dizziness or visual disturbances. 3. Avoid touching the patch. Wash your hands with soap and water after contact with the patch.        Patient Signature:  ________________________________________________________  Nurse's Signature:  ________________________________________________________

## 2019-07-22 ENCOUNTER — Encounter (HOSPITAL_BASED_OUTPATIENT_CLINIC_OR_DEPARTMENT_OTHER): Payer: Self-pay | Admitting: Urology

## 2019-07-22 NOTE — Anesthesia Postprocedure Evaluation (Signed)
Anesthesia Post Note  Patient: Felicia Leonard  Procedure(s) Performed: CYSTOSCOPY WITH BIOPSY FULGURATION WITH INSTILL GEMCITABINE IN PACU (N/A Bladder) CYSTOSCOPY WITH RETROGRADE PYELOGRAM (Bilateral Renal)     Patient location during evaluation: PACU Anesthesia Type: General Level of consciousness: awake and alert Pain management: pain level controlled Vital Signs Assessment: post-procedure vital signs reviewed and stable Respiratory status: spontaneous breathing, nonlabored ventilation, respiratory function stable and patient connected to nasal cannula oxygen Cardiovascular status: blood pressure returned to baseline and stable Postop Assessment: no apparent nausea or vomiting Anesthetic complications: no    Last Vitals:  Vitals:   07/21/19 1130 07/21/19 1310  BP: 118/81 135/90  Pulse: 67 77  Resp: 11 16  Temp:  (!) 36.4 C  SpO2: 96% 99%    Last Pain:  Vitals:   07/21/19 1310  TempSrc:   PainSc: 0-No pain                 Ariannah Arenson

## 2019-08-03 DIAGNOSIS — C678 Malignant neoplasm of overlapping sites of bladder: Secondary | ICD-10-CM | POA: Diagnosis not present

## 2019-08-03 DIAGNOSIS — R300 Dysuria: Secondary | ICD-10-CM | POA: Diagnosis not present

## 2019-08-03 DIAGNOSIS — D090 Carcinoma in situ of bladder: Secondary | ICD-10-CM | POA: Diagnosis not present

## 2019-08-15 ENCOUNTER — Other Ambulatory Visit: Payer: Self-pay

## 2019-08-15 ENCOUNTER — Ambulatory Visit
Admission: RE | Admit: 2019-08-15 | Discharge: 2019-08-15 | Disposition: A | Discharge status: Home / Self Care | Payer: 59 | Source: Ambulatory Visit | Attending: Certified Nurse Midwife | Admitting: Certified Nurse Midwife

## 2019-08-15 DIAGNOSIS — R29890 Loss of height: Secondary | ICD-10-CM

## 2019-08-15 DIAGNOSIS — M85851 Other specified disorders of bone density and structure, right thigh: Secondary | ICD-10-CM | POA: Diagnosis not present

## 2019-08-15 DIAGNOSIS — Z780 Asymptomatic menopausal state: Secondary | ICD-10-CM

## 2019-08-16 ENCOUNTER — Other Ambulatory Visit: Payer: Self-pay | Admitting: Certified Nurse Midwife

## 2019-08-17 ENCOUNTER — Ambulatory Visit
Admission: RE | Admit: 2019-08-17 | Discharge: 2019-08-17 | Disposition: A | Discharge status: Home / Self Care | Payer: 59 | Source: Ambulatory Visit | Attending: Internal Medicine | Admitting: Internal Medicine

## 2019-08-17 DIAGNOSIS — R911 Solitary pulmonary nodule: Secondary | ICD-10-CM

## 2019-08-17 DIAGNOSIS — R918 Other nonspecific abnormal finding of lung field: Secondary | ICD-10-CM | POA: Diagnosis not present

## 2019-08-19 DIAGNOSIS — Z5111 Encounter for antineoplastic chemotherapy: Secondary | ICD-10-CM | POA: Diagnosis not present

## 2019-08-19 DIAGNOSIS — C678 Malignant neoplasm of overlapping sites of bladder: Secondary | ICD-10-CM | POA: Diagnosis not present

## 2019-08-22 MED FILL — LIOTHYRONINE SODIUM 5 MCG T: 5 | 90 days supply | Qty: 180 | Fill #1

## 2019-08-22 MED FILL — LEVOTHYROXINE 88 MCG TABLET: 88 | 30 days supply | Qty: 30 | Fill #1

## 2019-08-26 DIAGNOSIS — C678 Malignant neoplasm of overlapping sites of bladder: Secondary | ICD-10-CM | POA: Diagnosis not present

## 2019-08-26 DIAGNOSIS — Z5111 Encounter for antineoplastic chemotherapy: Secondary | ICD-10-CM | POA: Diagnosis not present

## 2019-09-01 ENCOUNTER — Other Ambulatory Visit: Payer: Self-pay | Admitting: *Deleted

## 2019-09-01 NOTE — Patient Outreach (Signed)
[name]  09/01/2019  Felicia Leonard [birth year] [id number]   Monthly telephonic UMR case management review [facility] RNCMs and Dr Alonza Bogus. Update provided to include: patient had OP cysto with bx on 8/27. Pathology showed non invasive low grade papillary urothelial carcinoma and carcinoma in situ. CT of chest done 9/23 for 2018 pulmonary nodule f/u- no change.  Barrington Ellison RN,CCM,CDE [place] Management Coordinator Office Phone [phone] Office Fax [phone]

## 2019-09-02 DIAGNOSIS — C678 Malignant neoplasm of overlapping sites of bladder: Secondary | ICD-10-CM | POA: Diagnosis not present

## 2019-09-02 DIAGNOSIS — Z5111 Encounter for antineoplastic chemotherapy: Secondary | ICD-10-CM | POA: Diagnosis not present

## 2019-09-09 DIAGNOSIS — Z5111 Encounter for antineoplastic chemotherapy: Secondary | ICD-10-CM | POA: Diagnosis not present

## 2019-09-09 DIAGNOSIS — C678 Malignant neoplasm of overlapping sites of bladder: Secondary | ICD-10-CM | POA: Diagnosis not present

## 2019-09-16 DIAGNOSIS — Z5111 Encounter for antineoplastic chemotherapy: Secondary | ICD-10-CM | POA: Diagnosis not present

## 2019-09-16 DIAGNOSIS — C678 Malignant neoplasm of overlapping sites of bladder: Secondary | ICD-10-CM | POA: Diagnosis not present

## 2019-09-18 MED FILL — LEVOTHYROXINE 88 MCG TABLET: 88 | 30 days supply | Qty: 30 | Fill #2

## 2019-09-20 ENCOUNTER — Other Ambulatory Visit: Payer: Self-pay | Admitting: Internal Medicine

## 2019-09-20 DIAGNOSIS — Z1231 Encounter for screening mammogram for malignant neoplasm of breast: Secondary | ICD-10-CM

## 2019-09-20 MED FILL — clonazePAM 1 MG TABS: 1 | 60 days supply | Qty: 90 | Fill #0

## 2019-09-23 DIAGNOSIS — C678 Malignant neoplasm of overlapping sites of bladder: Secondary | ICD-10-CM | POA: Diagnosis not present

## 2019-09-23 DIAGNOSIS — Z5111 Encounter for antineoplastic chemotherapy: Secondary | ICD-10-CM | POA: Diagnosis not present

## 2019-10-30 MED FILL — LEVOTHYROXINE 88 MCG TABLET: 88 | 30 days supply | Qty: 30 | Fill #3

## 2019-11-08 ENCOUNTER — Other Ambulatory Visit: Payer: Self-pay

## 2019-11-08 ENCOUNTER — Ambulatory Visit
Admission: RE | Admit: 2019-11-08 | Discharge: 2019-11-08 | Disposition: A | Discharge status: Home / Self Care | Payer: 59 | Source: Ambulatory Visit | Attending: Internal Medicine | Admitting: Internal Medicine

## 2019-11-08 DIAGNOSIS — Z1231 Encounter for screening mammogram for malignant neoplasm of breast: Secondary | ICD-10-CM

## 2019-11-13 MED FILL — LIOTHYRONINE SODIUM 5 MCG T: 5 | 90 days supply | Qty: 180 | Fill #2

## 2019-11-28 MED FILL — LEVOTHYROXINE 88 MCG TABLET: 88 | 30 days supply | Qty: 30 | Fill #0

## 2019-12-01 LAB — IFOBT (OCCULT BLOOD): IFOBT: NEGATIVE

## 2019-12-02 MED FILL — DOXYCYCLINE HYCLATE 100 MG: 100 | 7 days supply | Qty: 14 | Fill #0

## 2019-12-31 MED FILL — LEVOTHYROXINE 88 MCG TABLET: 88 | 30 days supply | Qty: 30 | Fill #1

## 2020-01-02 MED FILL — clonazePAM 1 MG TABS: 1 | 60 days supply | Qty: 90 | Fill #0

## 2020-01-30 MED FILL — LEVOTHYROXINE 88 MCG TABLET: 88 | 30 days supply | Qty: 30 | Fill #0

## 2020-02-13 ENCOUNTER — Encounter: Payer: Self-pay | Admitting: Certified Nurse Midwife

## 2020-02-15 MED FILL — LIOTHYRONINE SODIUM 5 MCG T: 5 | 90 days supply | Qty: 180 | Fill #3

## 2020-02-26 MED FILL — LEVOTHYROXINE 88 MCG TABLET: 88 | 30 days supply | Qty: 30 | Fill #1

## 2020-02-27 MED FILL — clonazePAM 1 MG TABS: 1 | 60 days supply | Qty: 90 | Fill #0

## 2020-03-31 MED FILL — LEVOTHYROXINE 88 MCG TABLET: 88 | 30 days supply | Qty: 30 | Fill #2

## 2020-04-10 ENCOUNTER — Encounter: Payer: Self-pay | Admitting: General Practice

## 2020-05-02 ENCOUNTER — Other Ambulatory Visit (HOSPITAL_COMMUNITY): Payer: Self-pay | Admitting: Internal Medicine

## 2020-05-16 MED FILL — LIOTHYRONINE SODIUM 5 MCG T: 5 | 90 days supply | Qty: 180 | Fill #0

## 2020-05-30 ENCOUNTER — Ambulatory Visit: Payer: 59 | Admitting: Certified Nurse Midwife

## 2020-06-05 MED FILL — CEFDINIR 300 MG CAPSULE: 300 | 7 days supply | Qty: 14 | Fill #0

## 2020-06-20 MED FILL — ALBUTEROL SULFATE HFA 108 (: 108 (90 BAS | 16 days supply | Qty: 18 | Fill #0

## 2020-06-25 ENCOUNTER — Other Ambulatory Visit: Payer: Self-pay | Admitting: Internal Medicine

## 2020-06-25 DIAGNOSIS — I712 Thoracic aortic aneurysm, without rupture, unspecified: Secondary | ICD-10-CM

## 2020-07-18 ENCOUNTER — Ambulatory Visit: Payer: 59 | Admitting: Cardiovascular Disease

## 2020-07-31 ENCOUNTER — Other Ambulatory Visit (HOSPITAL_COMMUNITY): Payer: Self-pay | Admitting: Internal Medicine

## 2020-07-31 MED FILL — CLONAZEPAM 1 MG TABS: 1 | 60 days supply | Qty: 90 | Fill #0

## 2020-07-31 MED FILL — LEVOTHYROXINE 88 MCG TABLET: 88 | 90 days supply | Qty: 90 | Fill #0

## 2020-08-01 ENCOUNTER — Other Ambulatory Visit (HOSPITAL_COMMUNITY): Payer: Self-pay | Admitting: Internal Medicine

## 2020-08-11 MED FILL — LIOTHYRONINE SODIUM 5 MCG T: 5 | 90 days supply | Qty: 180 | Fill #1

## 2020-08-20 ENCOUNTER — Other Ambulatory Visit: Payer: 59

## 2020-08-23 ENCOUNTER — Ambulatory Visit
Admission: RE | Admit: 2020-08-23 | Discharge: 2020-08-23 | Disposition: A | Discharge status: Home / Self Care | Payer: No Typology Code available for payment source | Source: Ambulatory Visit | Attending: Internal Medicine | Admitting: Internal Medicine

## 2020-08-23 DIAGNOSIS — I712 Thoracic aortic aneurysm, without rupture, unspecified: Secondary | ICD-10-CM

## 2020-08-23 MED ORDER — IOPAMIDOL (ISOVUE-370) INJECTION 76%
75.0000 mL | Freq: Once | INTRAVENOUS | Status: AC | PRN
  Administered 2020-08-23: 75 mL via INTRAVENOUS

## 2020-08-27 ENCOUNTER — Telehealth (INDEPENDENT_AMBULATORY_CARE_PROVIDER_SITE_OTHER): Payer: No Typology Code available for payment source | Admitting: Cardiovascular Disease

## 2020-08-27 ENCOUNTER — Encounter: Payer: Self-pay | Admitting: Cardiovascular Disease

## 2020-08-27 VITALS — Ht 68.5 in | Wt 175.0 lb

## 2020-08-27 DIAGNOSIS — Z720 Tobacco use: Secondary | ICD-10-CM | POA: Diagnosis not present

## 2020-08-27 DIAGNOSIS — I712 Thoracic aortic aneurysm, without rupture: Secondary | ICD-10-CM

## 2020-08-27 DIAGNOSIS — I7121 Aneurysm of the ascending aorta, without rupture: Secondary | ICD-10-CM

## 2020-08-27 DIAGNOSIS — I2510 Atherosclerotic heart disease of native coronary artery without angina pectoris: Secondary | ICD-10-CM

## 2020-08-27 HISTORY — DX: Tobacco use: Z72.0

## 2020-08-27 NOTE — Patient Instructions (Addendum)
Medication Instructions:  Your physician recommends that you continue on your current medications as directed. Please refer to the Current Medication list given to you today.   *If you need a refill on your cardiac medications before your next appointment, please call your pharmacy*  Lab Work: NONE  Testing/Procedures: MRA OF [place] IN 2023  Follow-Up: At [facility], you and your health needs are our priority.  As part of our continuing mission to provide you with exceptional heart care, we have created designated Provider Care Teams.  These Care Teams include your primary Cardiologist (physician) and Advanced Practice Providers (APPs -  Physician Assistants and Nurse Practitioners) who all work together to provide you with the care you need, when you need it.  We recommend signing up for the patient portal called "MyChart".  Sign up information is provided on this After Visit Summary.  MyChart is used to connect with patients for Virtual Visits (Telemedicine).  Patients are able to view lab/test results, encounter notes, upcoming appointments, etc.  Non-urgent messages can be sent to your provider as well.   To learn more about what you can do with MyChart, go to [email or web address].    Your next appointment:   12 month(s)  You will receive a reminder letter in the mail two months in advance. If you don't receive a letter, please call our office to schedule the follow-up appointment.  The format for your next appointment:   In Person  Provider:   You may see Skeet Latch, MD or one of the following Advanced Practice Providers on your designated Care Team:    Kerin Ransom, PA-C  [place], [place]  Coletta Memos, [place]

## 2020-08-27 NOTE — Progress Notes (Signed)
Virtual Visit via Telephone Note   This visit type was conducted due to national recommendations for restrictions regarding the COVID-19 Pandemic (e.g. social distancing) in an effort to limit this patient's exposure and mitigate transmission in our community.  Due to her co-morbid illnesses, this patient is at least at moderate risk for complications without adequate follow up.  This format is felt to be most appropriate for this patient at this time.  The patient did not have access to video technology/had technical difficulties with video requiring transitioning to audio format only (telephone).  All issues noted in this document were discussed and addressed.  No physical exam could be performed with this format.  Please refer to the patient's chart for her  consent to telehealth for [name].   Evaluation Performed:  Follow-up visit  Date:  08/27/2020   ID:  Felicia Leonard, DOB [birth year], MRN [id number]  Patient Location: Home Provider Location: Office  PCP:  Velna Hatchet, MD  Cardiologist:  Skeet Latch, MD  Electrophysiologist:  None   Chief Complaint:  Follow up  History of Present Illness:    Felicia Leonard is a 59 y.o. female with mild ascending aorta aneurysm, coronary calcification, COPD, ongoing tobacco use, hiatal hernia and prior bladder cancer here for follow up.  She was initially seen 11/2018 for the evaluation of coronary calcification.  She had a chest CT 07/2018 that showed a small ascending aorta aneurysm (4.0 cm) and calcification of the LAD.  This has been stable and 2020 and 2021.  She was feeling well at that appointment but had exertional dyspnea and did not get any exercise.  Therefore she was referred for an ETT 12/29/2018 that was negative for ischemia.  She achieved 10.1 METS on a Bruce protocol.  Right lower extremity pulses were diminished and she was referred for ABIs 12/29/2018 that revealed normal blood flow in both legs.    Since her last  appointment she has been doing well.  She just had a new grandchild one week ago.  She has been helping her daughter and grandchildren but isn't getting much formal exercise.  She is active with her grandchildren and is active at work.  Since her last appointment she had surgery on her bladder 06/2019 for urothelial carcinoma.  They were able to get clean margins and she is doing well.  She has been feeling generally well and has no complaints.  She denies any lower extremity edema, orthopnea, or PND.  She continues to have some intermittent pain in her shoulder but she attributes that to a rotator cuff.  The patient does not have symptoms concerning for COVID-19 infection (fever, chills, cough, or new shortness of breath).     Past Medical History:  Diagnosis Date  . Aneurysm of ascending aorta (HCC) 12/14/2018   4.0 cm 07/2018.  (followed by dr t. Oval Linsey)  . Anxiety   . Arthritis   . Bladder cancer ([place])   . COPD (chronic obstructive pulmonary disease) ([place])   . Coronary artery calcification seen on CAT scan    cardiologist-- dr t. Oval Linsey  . Depression   . Diverticulosis of colon   . GERD (gastroesophageal reflux disease)    occasional , no meds  . Hemorrhoids   . Hiatal hernia   . History of anal fissures   . History of colon polyps   . History of diverticulitis of colon   . History of endometriosis   . History of exercise stress test  ETT 12-29-2018--- negative for ischemia  . History of Helicobacter pylori infection    approx. 2010 per pt  . History of recurrent UTIs    "all my childhood"  . Hypothyroidism    followed by pcp  . Psoriasis   . Psoriatic arthritis ([place])   . Smokers' cough ([place])   . Tobacco abuse 08/27/2020  . Varicose veins of both lower extremities   . Vitamin D deficiency    Past Surgical History:  Procedure Laterality Date  . CESAREAN SECTION  1988  . COLONOSCOPY  last one 2018  . CYSTOSCOPY W/ RETROGRADES Bilateral 07/21/2019   Procedure:  CYSTOSCOPY WITH RETROGRADE PYELOGRAM;  Surgeon: [name], MD;  Location: [facility];  Service: Urology;  Laterality: Bilateral;  . CYSTOSCOPY WITH BIOPSY N/A 07/21/2019   Procedure: CYSTOSCOPY WITH BIOPSY FULGURATION WITH INSTILL GEMCITABINE IN PACU;  Surgeon: [name], MD;  Location: [facility];  Service: Urology;  Laterality: N/A;  . ESOPHAGOGASTRODUODENOSCOPY  2010  . LUMBAR SPINE SURGERY  2005  . MICROLARYNGOSCOPY N/A 03/26/2018   Procedure: MICROLARYNGOSCOPY WITH VOCAL CORD BIOPSY;  Surgeon: Melida Quitter, MD;  Location: [place];  Service: ENT;  Laterality: N/A;  . TONSILLECTOMY  2009  approx.  . TRANSURETHRAL RESECTION OF BLADDER TUMOR  2003   dr Jeffie Pollock  . VAGINAL HYSTERECTOMY  2004   w/  Unilateral Salpingo-oophorectomy     Current Meds  Medication Sig  . ASPIRIN 81 PO Take by mouth.  . Betamethasone Valerate 0.12 % foam as needed.  . calcipotriene (DOVONOX) 0.005 % cream Apply topically as needed.   . calcipotriene-betamethasone (TACLONEX) ointment as needed.  Marland Kitchen CALCIUM PO Take by mouth daily.  . cetirizine (ZYRTEC) 10 MG tablet Take 10 mg by mouth at bedtime.   . Cholecalciferol (VITAMIN D3) 50 MCG (2000 UT) TABS Take 1 capsule by mouth daily with lunch.  . Clobetasol Propionate Emulsion 0.05 % topical foam as needed.   . clonazePAM (KLONOPIN) 1 MG tablet Take 1 mg by mouth as needed. 1 to 1 and 1/2 daily AS NEEDED  . Cyanocobalamin (VITAMIN B-12 CR PO) Take 1 tablet by mouth daily.  Marland Kitchen levothyroxine (SYNTHROID, LEVOTHROID) 88 MCG tablet Take 88 mcg by mouth daily before breakfast.   . liothyronine (CYTOMEL) 5 MCG tablet Take 10 mcg by mouth daily.   . ondansetron (ZOFRAN) 4 MG tablet Take 4 mg by mouth every 8 (eight) hours as needed for nausea or vomiting.  Marland Kitchen zolpidem (AMBIEN) 10 MG tablet Take 10 mg by mouth at bedtime as needed. Reported on 02/12/2016     Allergies:   Chantix [varenicline], Erythromycin base, Penicillins,  Sulfa antibiotics, Sulfasalazine, and Prilosec [omeprazole]   Social History   Tobacco Use  . Smoking status: Current Every Day Smoker    Packs/day: 1.00    Years: 25.00    Pack years: 25.00    Types: Cigarettes  . Smokeless tobacco: Never Used  Vaping Use  . Vaping Use: Never used  Substance Use Topics  . Alcohol use: No    Alcohol/week: 0.0 standard drinks  . Drug use: Never     Family Hx: The patient's family history includes Alcohol abuse in her father; Breast cancer in her maternal grandmother; Cancer in her mother; Colon cancer in her mother; Dementia in her maternal grandmother and paternal grandmother; Heart attack in her maternal grandfather; Heart disease in her father; Hypertension in her father; Liver cancer in her mother and paternal uncle; Peripheral  Artery Disease in her maternal grandmother; Stroke in her maternal grandmother; Thyroid disease in her father. There is no history of Esophageal cancer, Pancreatic cancer, Rectal cancer, or Stomach cancer.  ROS:   Please see the history of present illness.    depression All other systems reviewed and are negative.   Prior CV studies:   The following studies were reviewed today:  ETT 12/29/2018:  Blood pressure demonstrated a normal response to exercise.  There was no ST segment deviation noted during stress.   ETT with good exercise tolerance (9:00); no chest pain; normal blood pressure response; no ST changes; negative adequate exercise tolerance test; Duke treadmill score 9.  Labs/Other Tests and Data Reviewed:    EKG:  An ECG dated 12/14/18 was personally reviewed today and demonstrated:  sinus rhythm.  Rate 80 bpm.    Recent Labs: No results found for requested labs within last 8760 hours.   Recent Lipid Panel No results found for: CHOL, TRIG, HDL, CHOLHDL, LDLCALC, LDLDIRECT  Wt Readings from Last 3 Encounters:  08/27/20 175 lb (79.4 kg)  07/21/19 181 lb 11.2 oz (82.4 kg)  05/30/19 188 lb (85.3 kg)      Objective:    Ht 5' 8.5" (1.74 m)   Wt 175 lb (79.4 kg)   LMP 11/24/2002   BMI 26.22 kg/m  GENERAL: Sounds well.  No acute distress. RESP: Respirations unlabored NEURO:  Speech fluent.   PSYCH:  Cognitively intact, oriented to person place and time   ASSESSMENT & PLAN:    # Coronary calcification: CT showed LAD calcification.  She had an ETT that was negative for ischemia 12/2018.  LDL 49 11/2018.  No statin. Continue aspirin 81mg  daily.   We will get a copy of her most recent lipids from her PCP.  # LE edema: Only happens with travel.  Recommend limiting sodium intake and wearing compression stockings.  # Elevated BP: Given her coronary disease her BP should be <130/80.    She does not have any recent blood pressure readings but thinks it has been well-controlled.  It was stable when she saw her PCP.  # Tobacco abuse:  She has been unable to quit with gum and is afraid to use patches because of her skin condition.  She is done to classes through [facility] and 1 800 quit now without success.  She is trying to wean slowly.  She is already smoking less because she has been around her grandchildren a lot lately.  # Ascending aorta aneurysm: 4.0 cm on CT and stable for several years.  Repeat 07/2022 with an MRI to reduce radiation.  # Claudication: ABIs normal 12/2018.  Continue aspirin.   COVID-19 Education: The signs and symptoms of COVID-19 were discussed with the patient and how to seek care for testing (follow up with PCP or arrange E-visit).  The importance of social distancing was discussed today.  Time:   Today, I have spent 22 minutes with the patient with telehealth technology discussing the above problems.     Medication Adjustments/Labs and Tests Ordered: Current medicines are reviewed at length with the patient today.  Concerns regarding medicines are outlined above.   Tests Ordered: No orders of the defined types were placed in this encounter.   Medication  Changes: No orders of the defined types were placed in this encounter.   Disposition:  Follow up in 1 year(s)  Signed, Skeet Latch, MD  08/27/2020 1:44 PM    [place]

## 2020-09-03 ENCOUNTER — Other Ambulatory Visit (HOSPITAL_COMMUNITY): Payer: Self-pay | Admitting: Dermatology

## 2020-09-03 MED FILL — CALCIPOTRIENE 0.005 % OINT: 0.005 | 30 days supply | Qty: 60 | Fill #0

## 2020-09-03 MED FILL — TRIAMCINOLONE 0.1% CREAM: 0.1 | 30 days supply | Qty: 454 | Fill #0

## 2020-09-03 MED FILL — HYDROCORTISONE 2.5% OINT: 2.5 | 30 days supply | Qty: 454 | Fill #0

## 2020-09-03 MED FILL — BETAMETHASONE DP 0.05% LOT: 0.05 | 30 days supply | Qty: 120 | Fill #0

## 2020-09-10 ENCOUNTER — Other Ambulatory Visit (HOSPITAL_COMMUNITY): Payer: Self-pay | Admitting: Dermatology

## 2020-09-10 MED FILL — KETOCONAZOLE 2% SHAMPOO: 2 | 20 days supply | Qty: 120 | Fill #0

## 2020-10-23 ENCOUNTER — Other Ambulatory Visit: Payer: Self-pay | Admitting: Internal Medicine

## 2020-10-23 DIAGNOSIS — Z1231 Encounter for screening mammogram for malignant neoplasm of breast: Secondary | ICD-10-CM

## 2020-10-25 MED FILL — LEVOTHYROXINE 88 MCG TABLET: 88 | 90 days supply | Qty: 90 | Fill #1

## 2020-10-25 MED FILL — LIOTHYRONINE SODIUM 5 MCG T: 5 | 90 days supply | Qty: 180 | Fill #2

## 2020-10-26 ENCOUNTER — Other Ambulatory Visit (HOSPITAL_COMMUNITY): Payer: Self-pay | Admitting: Internal Medicine

## 2020-10-26 MED FILL — CLONAZEPAM 1 MG TABS: 1 | 30 days supply | Qty: 45 | Fill #0

## 2020-11-19 ENCOUNTER — Other Ambulatory Visit (HOSPITAL_COMMUNITY): Payer: Self-pay | Admitting: Internal Medicine

## 2020-11-19 MED FILL — ONDANSETRON HCL 4 MG TABS: 4 | 30 days supply | Qty: 30 | Fill #0

## 2020-11-22 ENCOUNTER — Telehealth: Payer: Self-pay | Admitting: Pharmacist

## 2020-11-22 NOTE — Telephone Encounter (Signed)
Called patient to schedule an appointment for the [facility]. I was unable to reach the patient so I left a HIPAA-compliant message requesting that the patient return my call.   

## 2020-11-29 ENCOUNTER — Encounter: Payer: Self-pay | Admitting: Pharmacist

## 2020-11-29 ENCOUNTER — Ambulatory Visit (HOSPITAL_BASED_OUTPATIENT_CLINIC_OR_DEPARTMENT_OTHER): Payer: No Typology Code available for payment source | Admitting: Pharmacist

## 2020-11-29 ENCOUNTER — Other Ambulatory Visit: Payer: Self-pay

## 2020-11-29 ENCOUNTER — Other Ambulatory Visit: Payer: Self-pay | Admitting: Pharmacist

## 2020-11-29 DIAGNOSIS — Z79899 Other long term (current) drug therapy: Secondary | ICD-10-CM

## 2020-11-29 MED ORDER — OTEZLA 30 MG PO TABS: ORAL_TABLET | ORAL | 11 refills | Status: DC

## 2020-11-29 MED ORDER — OTEZLA 10 & 20 & 30 MG PO TBPK: ORAL_TABLET | ORAL | 0 refills | Status: DC

## 2020-11-29 NOTE — Progress Notes (Signed)
  S: Patient presents for review of their specialty medication therapy.  Patient is currently prescribedOtezla for psoriasis. Patient is managed by Dr. Roderic Scarce for this.   Adherence: has not yet started   Efficacy: has not yet started   Dosing:  Active psoriatic arthritis or plaque psoriasis (moderate to severe): Oral: Initial: 10 mg in the morning. Titrate upward by additional 10 mg per day on days 2 to 5 as follows: Day 2: 10 mg twice daily; Day 3: 10 mg in the morning and 20 mg in the evening; Day 4: 20 mg twice daily; Day 5: 20 mg in the morning and 30 mg in the evening. Maintenance dose: 30 mg twice daily starting on day 6  CrCl <30 mL/minute: Initial: 10 mg in the morning on days 1 to 3; titrate using morning doses only (skip evening doses) to 20 mg on days 4 and 5. Maintenance dose: 30 mg once daily in the morning starting on day 6.    Current adverse effects: Headache: counseling given GI upset: counseling given Weight loss: counseling given Neuropsychiatric effects: counseling given  O:     Lab Results  Component Value Date   WBC 8.4 10/05/2014   HGB 15.3 (H) 10/05/2014   HCT 45.1 10/05/2014   MCV 89.3 10/05/2014   PLT 234.0 10/05/2014      Chemistry      Component Value Date/Time   NA 139 10/05/2014 1221   K 4.5 10/05/2014 1221   CL 106 10/05/2014 1221   CO2 19 10/05/2014 1221   BUN 10 10/05/2014 1221   CREATININE 0.8 10/05/2014 1221      Component Value Date/Time   CALCIUM 9.7 10/05/2014 1221   ALKPHOS 88 01/30/2015 1016   AST 22 01/30/2015 1016   ALT 34 01/30/2015 1016   BILITOT 0.3 01/30/2015 1016       A/P: 1. Medication review: patient is currently prescribed Otezla for psoriasis. Reviewed the medication including the following: apremilast inhibits phosphodiesterase 4 (PDE4) specific for cyclic adenosine monophosphate (cAMP) which results in increased intracellular cAMP levels and regulation of numerous inflammatory mediators (eg, decreased  expression of nitric oxide synthase, TNF-alpha, and interleukin [IL]-23, as well as increased IL-10. Patient educated on purpose, proper use and potential adverse effects of Otezla. Possible adverse effects include weight loss, GI upset, headache, and mood changes. Renal function should be routinely monitored. Administer without regard to food. Do not crush, chew, or split tablets. No recommendations for any changes at this time.   Butch Penny, PharmD, [place]y [place], CPP Clinical Pharmacist [facility] [phone]

## 2020-12-05 ENCOUNTER — Ambulatory Visit: Payer: No Typology Code available for payment source

## 2020-12-28 ENCOUNTER — Other Ambulatory Visit: Payer: Self-pay

## 2020-12-28 ENCOUNTER — Ambulatory Visit
Admission: RE | Admit: 2020-12-28 | Discharge: 2020-12-28 | Disposition: A | Discharge status: Home / Self Care | Payer: No Typology Code available for payment source | Source: Ambulatory Visit | Attending: Internal Medicine | Admitting: Internal Medicine

## 2020-12-28 DIAGNOSIS — Z1231 Encounter for screening mammogram for malignant neoplasm of breast: Secondary | ICD-10-CM

## 2021-01-24 MED FILL — ZOLPIDEM TARTRATE 10 MG TAB: 10 | 90 days supply | Qty: 90 | Fill #0

## 2021-01-24 MED FILL — ONDANSETRON HCL 4 MG TABS: 4 | 30 days supply | Qty: 30 | Fill #1

## 2021-01-24 MED FILL — BETAMETHASONE DP 0.05% LOT: 0.05 | 30 days supply | Qty: 120 | Fill #1

## 2021-01-24 MED FILL — LIOTHYRONINE SODIUM 5 MCG T: 5 | 90 days supply | Qty: 180 | Fill #3

## 2021-01-24 MED FILL — CLONAZEPAM 1 MG TABS: 1 | 30 days supply | Qty: 45 | Fill #1

## 2021-01-24 MED FILL — CALCIPOTRIENE 0.005 % OINT: 0.005 | 30 days supply | Qty: 60 | Fill #1

## 2021-01-24 MED FILL — LEVOTHYROXINE 88 MCG TABLET: 88 | 90 days supply | Qty: 90 | Fill #2

## 2021-02-14 ENCOUNTER — Other Ambulatory Visit (HOSPITAL_COMMUNITY): Payer: Self-pay

## 2021-02-18 ENCOUNTER — Other Ambulatory Visit: Payer: Self-pay | Admitting: Pharmacist

## 2021-02-18 MED ORDER — OTEZLA 10 & 20 & 30 MG PO TBPK: ORAL_TABLET | ORAL | 0 refills | Status: DC

## 2021-02-19 MED FILL — OTEZLA 10 & 20 & 30 MG TBPK: 10 & 20 & 3 | 28 days supply | Qty: 55 | Fill #0

## 2021-02-21 ENCOUNTER — Other Ambulatory Visit (HOSPITAL_COMMUNITY): Payer: Self-pay | Admitting: Dermatology

## 2021-02-21 MED FILL — BETAMETHASONE DP 0.05% LOT: 0.05 | 30 days supply | Qty: 120 | Fill #0

## 2021-02-21 MED FILL — CALCIPOTRIENE 0.005 % OINT: 0.005 | 30 days supply | Qty: 60 | Fill #0

## 2021-02-21 MED FILL — TRIAMCINOLONE 0.1% CREAM: 0.1 | 30 days supply | Qty: 454 | Fill #0

## 2021-03-06 ENCOUNTER — Ambulatory Visit: Payer: No Typology Code available for payment source | Attending: Internal Medicine

## 2021-03-06 DIAGNOSIS — Z23 Encounter for immunization: Secondary | ICD-10-CM

## 2021-03-06 NOTE — Progress Notes (Signed)
   Covid-19 Vaccination Clinic  Name:  Felicia Leonard    MRN: [id number] DOB: 1961/08/17  03/06/2021  [name] was observed post Covid-19 immunization for 15 minutes without incident. She was provided with Vaccine Information Sheet and instruction to access the V-Safe system.   [name] was instructed to call 911 with any severe reactions post vaccine: Marland Kitchen Difficulty breathing  . Swelling of face and throat  . A fast heartbeat  . A bad rash all over body  . Dizziness and weakness   Immunizations Administered    Name Date Dose VIS Date Route   PFIZER Comrnaty(Gray TOP) Covid-19 Vaccine 03/06/2021 10:01 AM 0.3 mL 11/01/2020 Intramuscular   Manufacturer: [employer], [employer]   Lot: [id number]   NDC: [phone]

## 2021-03-11 ENCOUNTER — Other Ambulatory Visit (HOSPITAL_COMMUNITY): Payer: Self-pay

## 2021-03-11 MED ORDER — COVID-19 MRNA VAC-TRIS(PFIZER) 30 MCG/0.3ML IM SUSP
INTRAMUSCULAR | 0 refills | Status: AC
  Filled 2021-03-11: qty 0.3, 17d supply, fill #0

## 2021-03-12 ENCOUNTER — Other Ambulatory Visit (HOSPITAL_COMMUNITY): Payer: Self-pay

## 2021-03-13 ENCOUNTER — Other Ambulatory Visit (HOSPITAL_COMMUNITY): Payer: Self-pay

## 2021-03-14 ENCOUNTER — Other Ambulatory Visit (HOSPITAL_COMMUNITY): Payer: Self-pay

## 2021-03-14 MED FILL — Apremilast Tab 30 MG: ORAL | 30 days supply | Qty: 60 | Fill #0 | Status: AC

## 2021-03-15 ENCOUNTER — Other Ambulatory Visit (HOSPITAL_COMMUNITY): Payer: Self-pay

## 2021-03-21 ENCOUNTER — Other Ambulatory Visit (HOSPITAL_COMMUNITY): Payer: Self-pay

## 2021-03-26 MED FILL — Clonazepam Tab 1 MG: ORAL | 30 days supply | Qty: 45 | Fill #0 | Status: AC

## 2021-03-27 ENCOUNTER — Other Ambulatory Visit (HOSPITAL_COMMUNITY): Payer: Self-pay

## 2021-04-12 ENCOUNTER — Other Ambulatory Visit (HOSPITAL_COMMUNITY): Payer: Self-pay

## 2021-04-16 ENCOUNTER — Other Ambulatory Visit (HOSPITAL_COMMUNITY): Payer: Self-pay

## 2021-04-16 MED FILL — Apremilast Tab 30 MG: ORAL | 30 days supply | Qty: 60 | Fill #1 | Status: AC

## 2021-04-25 ENCOUNTER — Other Ambulatory Visit (HOSPITAL_COMMUNITY): Payer: Self-pay

## 2021-05-01 ENCOUNTER — Other Ambulatory Visit (HOSPITAL_COMMUNITY): Payer: Self-pay

## 2021-05-01 MED ORDER — NITROFURANTOIN MONOHYD MACRO 100 MG PO CAPS
100.0000 mg | ORAL_CAPSULE | Freq: Two times a day (BID) | ORAL | 0 refills | Status: DC
  Filled 2021-05-01: qty 10, 5d supply, fill #0

## 2021-05-01 MED ORDER — ONDANSETRON HCL 4 MG PO TABS
4.0000 mg | ORAL_TABLET | Freq: Four times a day (QID) | ORAL | 0 refills | Status: DC | PRN
  Filled 2021-05-01: qty 120, 30d supply, fill #0

## 2021-05-02 ENCOUNTER — Other Ambulatory Visit (HOSPITAL_COMMUNITY): Payer: Self-pay

## 2021-05-02 MED ORDER — ZOLPIDEM TARTRATE 10 MG PO TABS
ORAL_TABLET | ORAL | 0 refills | Status: DC
  Filled 2021-05-02: qty 90, 90d supply, fill #0

## 2021-05-02 MED ORDER — LEVOTHYROXINE SODIUM 88 MCG PO TABS
ORAL_TABLET | ORAL | 3 refills | Status: DC
  Filled 2021-05-02: qty 90, 90d supply, fill #0
  Filled 2021-07-27: qty 90, 90d supply, fill #1
  Filled 2021-10-24: qty 90, 90d supply, fill #2

## 2021-05-02 MED ORDER — LIOTHYRONINE SODIUM 5 MCG PO TABS
ORAL_TABLET | ORAL | 3 refills | Status: DC
  Filled 2021-05-02: qty 180, 90d supply, fill #0
  Filled 2021-07-27: qty 180, 90d supply, fill #1
  Filled 2021-10-24: qty 180, 90d supply, fill #2
  Filled 2022-01-17: qty 180, 90d supply, fill #3

## 2021-05-02 MED ORDER — LEVOTHYROXINE SODIUM 88 MCG PO TABS
ORAL_TABLET | ORAL | 3 refills | Status: DC
  Filled 2021-05-02 – 2022-01-17 (×2): qty 90, 90d supply, fill #0

## 2021-05-07 ENCOUNTER — Other Ambulatory Visit: Payer: Self-pay | Admitting: Internal Medicine

## 2021-05-07 ENCOUNTER — Ambulatory Visit
Admission: RE | Admit: 2021-05-07 | Discharge: 2021-05-07 | Disposition: A | Discharge status: Home / Self Care | Payer: No Typology Code available for payment source | Source: Ambulatory Visit | Attending: Internal Medicine | Admitting: Internal Medicine

## 2021-05-07 DIAGNOSIS — R109 Unspecified abdominal pain: Secondary | ICD-10-CM

## 2021-05-07 MED ORDER — IOPAMIDOL (ISOVUE-300) INJECTION 61%
100.0000 mL | Freq: Once | INTRAVENOUS | Status: AC | PRN
  Administered 2021-05-07: 100 mL via INTRAVENOUS

## 2021-05-08 ENCOUNTER — Other Ambulatory Visit (HOSPITAL_COMMUNITY): Payer: Self-pay

## 2021-05-08 MED FILL — Apremilast Tab 30 MG: ORAL | 30 days supply | Qty: 60 | Fill #2 | Status: AC

## 2021-05-09 ENCOUNTER — Other Ambulatory Visit: Payer: Self-pay | Admitting: Internal Medicine

## 2021-05-09 DIAGNOSIS — R911 Solitary pulmonary nodule: Secondary | ICD-10-CM

## 2021-05-09 DIAGNOSIS — C679 Malignant neoplasm of bladder, unspecified: Secondary | ICD-10-CM

## 2021-05-14 ENCOUNTER — Other Ambulatory Visit (HOSPITAL_COMMUNITY): Payer: Self-pay

## 2021-05-15 ENCOUNTER — Other Ambulatory Visit (HOSPITAL_COMMUNITY): Payer: Self-pay

## 2021-05-16 ENCOUNTER — Other Ambulatory Visit (HOSPITAL_COMMUNITY): Payer: Self-pay

## 2021-05-18 ENCOUNTER — Other Ambulatory Visit: Payer: Self-pay

## 2021-05-18 ENCOUNTER — Ambulatory Visit
Admission: RE | Admit: 2021-05-18 | Discharge: 2021-05-18 | Disposition: A | Discharge status: Home / Self Care | Payer: No Typology Code available for payment source | Source: Ambulatory Visit | Attending: Internal Medicine | Admitting: Internal Medicine

## 2021-05-18 DIAGNOSIS — R911 Solitary pulmonary nodule: Secondary | ICD-10-CM

## 2021-05-18 DIAGNOSIS — C679 Malignant neoplasm of bladder, unspecified: Secondary | ICD-10-CM

## 2021-05-29 ENCOUNTER — Other Ambulatory Visit: Payer: Self-pay

## 2021-05-29 ENCOUNTER — Other Ambulatory Visit: Payer: Self-pay | Admitting: Internal Medicine

## 2021-05-29 DIAGNOSIS — R911 Solitary pulmonary nodule: Secondary | ICD-10-CM

## 2021-06-03 ENCOUNTER — Other Ambulatory Visit (HOSPITAL_COMMUNITY): Payer: Self-pay

## 2021-06-03 MED ORDER — LAGEVRIO 200 MG PO CAPS
ORAL_CAPSULE | ORAL | 0 refills | Status: DC
  Filled 2021-06-03: qty 40, 5d supply, fill #0

## 2021-06-03 MED ORDER — CARESTART COVID-19 HOME TEST VI KIT
PACK | 0 refills | Status: DC
  Filled 2021-06-03: qty 4, 4d supply, fill #0

## 2021-06-07 ENCOUNTER — Other Ambulatory Visit (HOSPITAL_COMMUNITY): Payer: Self-pay

## 2021-06-11 ENCOUNTER — Other Ambulatory Visit (HOSPITAL_COMMUNITY): Payer: Self-pay

## 2021-06-14 ENCOUNTER — Other Ambulatory Visit (HOSPITAL_COMMUNITY): Payer: Self-pay

## 2021-06-21 ENCOUNTER — Other Ambulatory Visit (HOSPITAL_COMMUNITY): Payer: Self-pay

## 2021-06-22 ENCOUNTER — Ambulatory Visit
Admission: RE | Admit: 2021-06-22 | Discharge: 2021-06-22 | Disposition: A | Discharge status: Home / Self Care | Payer: No Typology Code available for payment source | Source: Ambulatory Visit | Attending: Internal Medicine | Admitting: Internal Medicine

## 2021-06-22 ENCOUNTER — Other Ambulatory Visit: Payer: Self-pay

## 2021-06-22 ENCOUNTER — Other Ambulatory Visit (HOSPITAL_COMMUNITY): Payer: Self-pay

## 2021-06-22 DIAGNOSIS — R911 Solitary pulmonary nodule: Secondary | ICD-10-CM

## 2021-06-24 ENCOUNTER — Other Ambulatory Visit (HOSPITAL_COMMUNITY): Payer: Self-pay

## 2021-06-26 ENCOUNTER — Other Ambulatory Visit (HOSPITAL_COMMUNITY): Payer: Self-pay

## 2021-06-27 ENCOUNTER — Other Ambulatory Visit (HOSPITAL_COMMUNITY): Payer: Self-pay

## 2021-07-01 ENCOUNTER — Other Ambulatory Visit (HOSPITAL_COMMUNITY): Payer: Self-pay

## 2021-07-01 MED ORDER — CLONAZEPAM 1 MG PO TABS
ORAL_TABLET | ORAL | 2 refills | Status: DC
  Filled 2021-07-01: qty 45, 30d supply, fill #0
  Filled 2021-09-21: qty 45, 30d supply, fill #1
  Filled 2021-11-06: qty 45, 30d supply, fill #2

## 2021-07-11 ENCOUNTER — Other Ambulatory Visit (HOSPITAL_COMMUNITY): Payer: Self-pay

## 2021-07-11 MED ORDER — OTEZLA 30 MG PO TABS
30.0000 mg | ORAL_TABLET | Freq: Two times a day (BID) | ORAL | 11 refills | Status: DC
  Filled 2021-07-11: qty 60, 30d supply, fill #0

## 2021-07-12 ENCOUNTER — Other Ambulatory Visit (HOSPITAL_COMMUNITY): Payer: Self-pay

## 2021-07-27 ENCOUNTER — Other Ambulatory Visit (HOSPITAL_COMMUNITY): Payer: Self-pay

## 2021-08-15 ENCOUNTER — Other Ambulatory Visit (HOSPITAL_COMMUNITY): Payer: Self-pay

## 2021-08-15 MED ORDER — DOXYCYCLINE HYCLATE 100 MG PO TABS
100.0000 mg | ORAL_TABLET | Freq: Two times a day (BID) | ORAL | 0 refills | Status: DC
  Filled 2021-08-15: qty 14, 7d supply, fill #0

## 2021-08-15 MED ORDER — MUPIROCIN 2 % EX OINT
1.0000 "application " | TOPICAL_OINTMENT | Freq: Two times a day (BID) | CUTANEOUS | 0 refills | Status: DC
  Filled 2021-08-15: qty 22, 14d supply, fill #0

## 2021-08-15 MED ORDER — ALBUTEROL SULFATE HFA 108 (90 BASE) MCG/ACT IN AERS
2.0000 | INHALATION_SPRAY | RESPIRATORY_TRACT | 0 refills | Status: DC | PRN
  Filled 2021-08-15: qty 18, 17d supply, fill #0

## 2021-08-15 MED ORDER — CARESTART COVID-19 HOME TEST VI KIT
PACK | 0 refills | Status: DC
  Filled 2021-08-15: qty 4, 4d supply, fill #0

## 2021-08-21 ENCOUNTER — Other Ambulatory Visit (HOSPITAL_COMMUNITY): Payer: Self-pay

## 2021-09-02 ENCOUNTER — Other Ambulatory Visit (HOSPITAL_COMMUNITY): Payer: Self-pay

## 2021-09-02 MED ORDER — POLYMYXIN B-TRIMETHOPRIM 10000-0.1 UNIT/ML-% OP SOLN
1.0000 [drp] | Freq: Four times a day (QID) | OPHTHALMIC | 0 refills | Status: DC
  Filled 2021-09-02: qty 10, 5d supply, fill #0

## 2021-09-11 ENCOUNTER — Other Ambulatory Visit (HOSPITAL_COMMUNITY): Payer: Self-pay

## 2021-09-18 ENCOUNTER — Other Ambulatory Visit (HOSPITAL_COMMUNITY): Payer: Self-pay

## 2021-09-21 ENCOUNTER — Other Ambulatory Visit (HOSPITAL_COMMUNITY): Payer: Self-pay

## 2021-09-21 MED FILL — Calcipotriene Oint 0.005%: CUTANEOUS | 30 days supply | Qty: 60 | Fill #0 | Status: CN

## 2021-09-24 ENCOUNTER — Other Ambulatory Visit (HOSPITAL_COMMUNITY): Payer: Self-pay

## 2021-09-24 MED ORDER — HYDROCORTISONE 2.5 % EX OINT
TOPICAL_OINTMENT | CUTANEOUS | 0 refills | Status: AC
  Filled 2021-09-24: qty 454, 30d supply, fill #0

## 2021-09-27 ENCOUNTER — Other Ambulatory Visit (HOSPITAL_COMMUNITY): Payer: Self-pay

## 2021-09-27 MED FILL — Calcipotriene Oint 0.005%: CUTANEOUS | 20 days supply | Qty: 60 | Fill #0 | Status: AC

## 2021-10-01 ENCOUNTER — Other Ambulatory Visit (HOSPITAL_COMMUNITY): Payer: Self-pay

## 2021-10-03 ENCOUNTER — Other Ambulatory Visit (HOSPITAL_COMMUNITY): Payer: Self-pay

## 2021-10-25 ENCOUNTER — Other Ambulatory Visit (HOSPITAL_COMMUNITY): Payer: Self-pay

## 2021-11-06 ENCOUNTER — Other Ambulatory Visit (HOSPITAL_COMMUNITY): Payer: Self-pay

## 2021-12-20 ENCOUNTER — Other Ambulatory Visit (HOSPITAL_COMMUNITY): Payer: Self-pay

## 2021-12-21 ENCOUNTER — Other Ambulatory Visit (HOSPITAL_COMMUNITY): Payer: Self-pay

## 2021-12-23 ENCOUNTER — Other Ambulatory Visit (HOSPITAL_COMMUNITY): Payer: Self-pay

## 2021-12-23 MED ORDER — CLONAZEPAM 1 MG PO TABS
ORAL_TABLET | ORAL | 2 refills | Status: DC
  Filled 2021-12-23: qty 45, 30d supply, fill #0
  Filled 2022-01-17: qty 45, 30d supply, fill #1
  Filled 2022-03-09: qty 45, 30d supply, fill #2

## 2021-12-27 ENCOUNTER — Other Ambulatory Visit: Payer: Self-pay | Admitting: Internal Medicine

## 2021-12-27 DIAGNOSIS — Z1231 Encounter for screening mammogram for malignant neoplasm of breast: Secondary | ICD-10-CM

## 2022-01-09 ENCOUNTER — Ambulatory Visit
Admission: RE | Admit: 2022-01-09 | Discharge: 2022-01-09 | Disposition: A | Discharge status: Home / Self Care | Payer: No Typology Code available for payment source | Source: Ambulatory Visit | Attending: Internal Medicine | Admitting: Internal Medicine

## 2022-01-09 DIAGNOSIS — Z1231 Encounter for screening mammogram for malignant neoplasm of breast: Secondary | ICD-10-CM

## 2022-01-17 ENCOUNTER — Other Ambulatory Visit (HOSPITAL_COMMUNITY): Payer: Self-pay

## 2022-01-24 ENCOUNTER — Other Ambulatory Visit: Payer: Self-pay

## 2022-01-24 MED ORDER — AZITHROMYCIN 250 MG PO TABS
ORAL_TABLET | ORAL | 0 refills | Status: DC
  Filled 2022-01-24: qty 6, 5d supply, fill #0

## 2022-02-20 ENCOUNTER — Other Ambulatory Visit (HOSPITAL_COMMUNITY): Payer: Self-pay

## 2022-02-20 MED ORDER — BETAMETHASONE DIPROPIONATE 0.05 % EX OINT
TOPICAL_OINTMENT | CUTANEOUS | 3 refills | Status: AC
  Filled 2022-02-20: qty 45, 30d supply, fill #0

## 2022-02-20 MED ORDER — TRIAMCINOLONE ACETONIDE 0.1 % EX CREA
TOPICAL_CREAM | CUTANEOUS | 3 refills | Status: AC
  Filled 2022-02-20: qty 454, 30d supply, fill #0

## 2022-02-20 MED ORDER — BETAMETHASONE DIPROPIONATE 0.05 % EX LOTN
TOPICAL_LOTION | CUTANEOUS | 11 refills | Status: AC
  Filled 2022-02-20: qty 60, 30d supply, fill #0

## 2022-02-24 ENCOUNTER — Other Ambulatory Visit (HOSPITAL_COMMUNITY): Payer: Self-pay

## 2022-03-07 ENCOUNTER — Ambulatory Visit (INDEPENDENT_AMBULATORY_CARE_PROVIDER_SITE_OTHER): Payer: No Typology Code available for payment source | Admitting: Family

## 2022-03-07 ENCOUNTER — Other Ambulatory Visit (HOSPITAL_COMMUNITY): Payer: Self-pay

## 2022-03-07 ENCOUNTER — Encounter (HOSPITAL_BASED_OUTPATIENT_CLINIC_OR_DEPARTMENT_OTHER): Payer: Self-pay | Admitting: Family

## 2022-03-07 VITALS — BP 132/90 | HR 98 | Ht 68.5 in | Wt 179.9 lb

## 2022-03-07 DIAGNOSIS — Z720 Tobacco use: Secondary | ICD-10-CM | POA: Diagnosis not present

## 2022-03-07 DIAGNOSIS — E785 Hyperlipidemia, unspecified: Secondary | ICD-10-CM

## 2022-03-07 DIAGNOSIS — I2510 Atherosclerotic heart disease of native coronary artery without angina pectoris: Secondary | ICD-10-CM | POA: Diagnosis not present

## 2022-03-07 DIAGNOSIS — I7121 Aneurysm of the ascending aorta, without rupture: Secondary | ICD-10-CM | POA: Diagnosis not present

## 2022-03-07 DIAGNOSIS — I2584 Coronary atherosclerosis due to calcified coronary lesion: Secondary | ICD-10-CM

## 2022-03-07 MED ORDER — LOSARTAN POTASSIUM 25 MG PO TABS
25.0000 mg | ORAL_TABLET | Freq: Every day | ORAL | 1 refills | Status: DC
  Filled 2022-03-07: qty 90, 90d supply, fill #0
  Filled 2022-06-02: qty 90, 90d supply, fill #1

## 2022-03-07 NOTE — Patient Instructions (Signed)
Medication Instructions:  ?START Losartan 25 mg Take 1 tablet once a day  ?*If you need a refill on your cardiac medications before your next appointment, please call your pharmacy* ? ?Lab Work: ?None Ordered ? ?Testing/Procedures: ?None ordered ? ?Follow-Up: ?At [facility], you and your health needs are our priority.  As part of our continuing mission to provide you with exceptional heart care, we have created designated Provider Care Teams.  These Care Teams include your primary Cardiologist (physician) and Advanced Practice Providers (APPs -  Physician Assistants and Nurse Practitioners) who all work together to provide you with the care you need, when you need it. ? ?We recommend signing up for the patient portal called "MyChart".  Sign up information is provided on this After Visit Summary.  MyChart is used to connect with patients for Virtual Visits (Telemedicine).  Patients are able to view lab/test results, encounter notes, upcoming appointments, etc.  Non-urgent messages can be sent to your provider as well.   ?To learn more about what you can do with MyChart, go to [email or web address].   ? ?Your next appointment:   ?3-4  month(s) ? ?The format for your next appointment:   ?In Person ? ?Provider:   ?Skeet Latch, MD  ? ? ?Other Instructions ?If you decide you want to participate in the PREP program at the [facility] simply let us know. This is a 3 month program that is $100 and includes an exercise class twice per week as well as access to the [facility] facilities whenever wanted. ? ?If you decide you want a referral to psychology, please let us know. Mental health is an important part of heart health. We refer to to Dr. Elias Else. He is located at [facility] Primary care at [facility].  ?_________________________________________________________________ ? ?Information About Your Aneurysm ? ?One of your tests has shown an aneurysm of your ascending aorta. This has been stable over the past few  years. The word "aneurysm" refers to a bulge in an artery (blood vessel). Most people think of them in the context of an emergency, but yours was found incidentally. At this point there is nothing you need to do from a procedure standpoint, but there are some important things to keep in mind for day-to-day life. ? ?Mainstays of therapy for aneurysms include very good blood pressure control, healthy lifestyle, and avoiding tobacco products and street drugs. Research has raised concern that antibiotics in the fluoroquinolone class could be associated with increased risk of having an aneurysm develop or tear. This includes medicines that end in "floxacin," like Cipro or Levaquin. Make sure to discuss this information with other healthcare providers if you require antibiotics. ? ?Since aneurysms can run in families, you should discuss your diagnosis with first degree relatives as they may need to be screened for this. Regular mild-moderate physical exercise is important, but avoid heavy lifting/weight lifting over 30lbs, chopping wood, shoveling snow or digging heavy earth with a shovel. It is best to avoid activities that cause grunting or straining (medically referred to as a "Valsalva maneuver"). This happens when a person bears down against a closed throat to increase the strength of arm or abdominal muscles. There's often a tendency to do this when lifting heavy weights, doing sit-ups, push-ups or chin-ups, etc., but it may be harmful. ? ?This is a finding I would expect to be monitored periodically by your cardiology team. Most unruptured thoracic aortic aneurysms cause no symptoms, so they are often found during exams for other  conditions. Contact a health care provider if you develop any discomfort in your upper back, neck, abdomen, trouble swallowing, cough or hoarseness, or unexplained weight loss. Get help right away if you develop severe pain in your upper back or abdomen that may move into your chest and  arms, or any other concerning symptoms such as shortness of breath or fever. ? ? ?Important Information About Sugar ? ? ? ? ?  ? ?

## 2022-03-07 NOTE — Progress Notes (Signed)
? ?Office Visit  ?  ?Patient Name: Felicia Leonard ?Date of Encounter: 03/07/2022 ? ?PCP:  Velna Hatchet, MD ?  ?[place]  ?Cardiologist:  Skeet Latch, MD  ?Advanced Practice Provider:  No care team member to display ?Electrophysiologist:  None  ?   ?Chief Complaint  ?  ?Felicia Leonard is a 61 y.o. female with a hx of mild ascending aortic aneurysm, coronary calcification, COPD, ongoing tobacco use, psoriasis, hiatal hernia, prior bladder cancer presents today for follow-up of CAD ? ?Past Medical History  ?  ?Past Medical History:  ?Diagnosis Date  ? Aneurysm of ascending aorta (HCC) 12/14/2018  ? 4.0 cm 07/2018.  (followed by dr t. Oval Linsey)  ? Anxiety   ? Arthritis   ? Bladder cancer ([place])   ? COPD (chronic obstructive pulmonary disease) ([place])   ? Coronary artery calcification seen on CAT scan   ? cardiologist-- dr t. Oval Linsey  ? Depression   ? Diverticulosis of colon   ? GERD (gastroesophageal reflux disease)   ? occasional , no meds  ? Hemorrhoids   ? Hiatal hernia   ? History of anal fissures   ? History of colon polyps   ? History of diverticulitis of colon   ? History of endometriosis   ? History of exercise stress test   ? ETT 12-29-2018--- negative for ischemia  ? History of Helicobacter pylori infection   ? approx. 2010 per pt  ? History of recurrent UTIs   ? "all my childhood"  ? Hypothyroidism   ? followed by pcp  ? Psoriasis   ? Psoriatic arthritis ([place])   ? Smokers' cough ([place])   ? Tobacco abuse 08/27/2020  ? Varicose veins of both lower extremities   ? Vitamin D deficiency   ? ?Past Surgical History:  ?Procedure Laterality Date  ? [place]  ? COLONOSCOPY  last one 2018  ? CYSTOSCOPY W/ RETROGRADES Bilateral 07/21/2019  ? Procedure: CYSTOSCOPY WITH RETROGRADE PYELOGRAM;  Surgeon: [name], MD;  Location: [facility];  Service: Urology;  Laterality: Bilateral;  ? CYSTOSCOPY WITH BIOPSY N/A 07/21/2019  ? Procedure: CYSTOSCOPY WITH BIOPSY  FULGURATION WITH INSTILL GEMCITABINE IN PACU;  Surgeon: [name], MD;  Location: [facility];  Service: Urology;  Laterality: N/A;  ? ESOPHAGOGASTRODUODENOSCOPY  2010  ? LUMBAR SPINE SURGERY  2005  ? MICROLARYNGOSCOPY N/A 03/26/2018  ? Procedure: MICROLARYNGOSCOPY WITH VOCAL CORD BIOPSY;  Surgeon: Melida Quitter, MD;  Location: [place];  Service: ENT;  Laterality: N/A;  ? TONSILLECTOMY  2009  approx.  ? TRANSURETHRAL RESECTION OF BLADDER TUMOR  2003   dr Jeffie Pollock  ? VAGINAL HYSTERECTOMY  2004  ? w/  Unilateral Salpingo-oophorectomy  ? ? ?Allergies ? ?Allergies  ?Allergen Reactions  ? Chantix [Varenicline] Hives  ?  Rash on breast ?  ? Erythromycin Base Other (See Comments)  ?  UNKNOWN  ? Penicillins Hives  ? Sulfa Antibiotics Hives  ? Sulfasalazine Hives  ? Prilosec [Omeprazole] Rash  ? ? ?History of Present Illness  ?  ?Felicia Leonard is a 61 y.o. female with a hx of mild ascending aortic aneurysm, coronary calcification, COPD, ongoing tobacco use, psoriasis, hiatal hernia, prior bladder cancer  last seen 08/2020 via telemedicine by Dr. Oval Linsey. ? ?Evaluated January 2020 by Dr. Rhona Leavens for evaluation of coronary calcification.  CT 07/2019 10 with small ascending aortic aneurysm 4.0 cm and calcification of the LAD.  ETT 12/29/2018 negative for ischemia.  She achieved 10 METS on a Bruce protocol.  Right lower extremity pulses were diminished and she was referred for ABIs 12/2018 that revealed normal blood flow in both legs.  She had surgery on her bladder August 2020 for urothelial carcinoma able to obtain clean margins. Most recent CT 05/2021 stable ascending aorta measurement 4.1 cm. ? ?She presents today for follow up.  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 Her daughter is an Therapist, sports and her other daughter is a Charity fundraiser.  She is expecting her newest granddaughter in about 6 weeks. Her daughter is 31 and living with her so newborn will be at home. Her  husband is disabled.  Endorses feeling stressed -we discussed referral to psychology and she tells me she will consider when he has more time.  Tells me she got COVID in July and does not feel she has recovered completely still having cough feeling fatigued.  Following closely with her PCP regarding her thyroid.  Notes exertional dyspnea only if she overheating. No lower extremity edema, orthopnea, PND.  ? ?EKGs/Labs/Other Studies Reviewed:  ? ?The following studies were reviewed today: ? ?CT Chest 05/2021 ?FINDINGS: ?Cardiovascular: Atherosclerotic calcification of the aorta and ?coronary arteries. Ascending aorta measures 4.1 cm. Heart size ?normal. No pericardial effusion. ?  ?Mediastinum/Nodes: No pathologically enlarged mediastinal or ?axillary lymph nodes. Hilar regions are difficult to definitively ?evaluate without IV contrast. Esophagus is grossly unremarkable. ?  ?Lungs/Pleura: Centrilobular and paraseptal emphysema. Resolving ?subpleural consolidation in the posterolateral right lower lobe ?(5/142). Scattered mucoid impaction. Pulmonary nodules measure up to ?4 mm in the right lower lobe (5/93), unchanged. No pleural fluid. ?Airway is unremarkable. ?  ?Upper Abdomen: Visualized portions of the liver, adrenal glands, ?kidneys, spleen, pancreas, stomach and bowel are grossly ?unremarkable. ?  ?Musculoskeletal: Degenerative changes in the spine. No worrisome ?lytic or sclerotic lesions. ?  ?IMPRESSION: ?1. No evidence of metastatic disease. ?2. Resolving subpleural consolidation in the posterolateral inferior ?right lower lobe. ?3. Ascending aortic aneurysm, stable. Recommend annual imaging ?followup by CTA or MRA. This recommendation follows 2010 ?ACCF/AHA/AATS/ACR/ASA/SCA/SCAI/SIR/STS/SVM Guidelines for the ?Diagnosis and Management of Patients with Thoracic Aortic Disease. ?Circulation. 2010; 121: [id number]. Aortic aneurysm NOS ?(ICD10-I71.9). ?4. Aortic atherosclerosis (ICD10-I70.0). Coronary  artery ?calcification. ?5.  Emphysema (ICD10-J43.9). ?  ?ETT 12/2018 ?Blood pressure demonstrated a normal response to exercise. ?There was no ST segment deviation noted during stress. ?  ?ETT with good exercise tolerance (9:00); no chest pain; normal blood pressure response; no ST changes; negative adequate exercise tolerance test; Duke treadmill score 9. ?  ? ?EKG:  EKG is  ordered today.  The ekg ordered today demonstrates NSR with no acute ST/T wave changes.  ? ?Recent Labs: ?No results found for requested labs within last 8760 hours.  ?Recent Lipid Panel ?No results found for: CHOL, TRIG, HDL, CHOLHDL, VLDL, LDLCALC, LDLDIRECT ? ? ?Home Medications  ? ?Current Meds  ?Medication Sig  ? albuterol (PROAIR HFA) 108 (90 Base) MCG/ACT inhaler Inhale 2 puffs into the lungs every 4 (four) to 6 (six) hours as needed for shortness of breath.  ? ASPIRIN 81 PO Take by mouth.  ? betamethasone dipropionate (DIPROLENE) 0.05 % ointment Apply as directed to affected area twice a day Taper use as able.  ? betamethasone dipropionate 0.05 % lotion Apply liberally to affected area once a day  ? Betamethasone Valerate 0.12 % foam as needed.  ? calcipotriene (DOVONOX) 0.005 % cream Apply topically as needed.   ? CALCIUM PO Take  by mouth daily.  ? cetirizine (ZYRTEC) 10 MG tablet Take 10 mg by mouth at bedtime.   ? Cholecalciferol (VITAMIN D3) 50 MCG (2000 UT) TABS Take 1 capsule by mouth daily with lunch.  ? clonazePAM (KLONOPIN) 1 MG tablet TAKE 1 TABLET BY MOUTH AT BEDTIME AS NEEDED--MAY TAKE 1/2 TABLET DAILY IF NEEDED  ? COVID-19 mRNA Vac-TriS, Pfizer, SUSP injection Inject into the muscle.  ? Cyanocobalamin (VITAMIN B-12 CR PO) Take 1 tablet by mouth daily.  ? hydrocortisone 2.5 % ointment Apply topically to the affected areas of the skin 1 to 2 times a day  ? levothyroxine (SYNTHROID) 88 MCG tablet TAKE 1 TABLET BY MOUTH ONCE DAILY  ? liothyronine (CYTOMEL) 5 MCG tablet TAKE 2 TABLETS BY MOUTH DAILY  ? losartan (COZAAR) 25 MG  tablet Take 1 tablet (25 mg total) by mouth daily.  ? mupirocin ointment (BACTROBAN) 2 % Apply 1 application topically 2 (two) times daily until healed.  ? ondansetron (ZOFRAN) 4 MG tablet Take 4 mg by mouth every 8 (eight) h

## 2022-03-10 ENCOUNTER — Other Ambulatory Visit (HOSPITAL_COMMUNITY): Payer: Self-pay

## 2022-03-26 ENCOUNTER — Other Ambulatory Visit (HOSPITAL_COMMUNITY): Payer: Self-pay

## 2022-03-26 MED ORDER — LEVOTHYROXINE SODIUM 88 MCG PO TABS
ORAL_TABLET | ORAL | 3 refills | Status: DC
Start: 2022-03-26 — End: 2023-04-13
  Filled 2022-03-26: qty 90, 90d supply, fill #0
  Filled 2022-07-19: qty 90, 90d supply, fill #1
  Filled 2022-10-13: qty 90, 90d supply, fill #2
  Filled 2022-12-26: qty 90, 90d supply, fill #3

## 2022-03-26 MED ORDER — ZOLPIDEM TARTRATE 10 MG PO TABS
10.0000 mg | ORAL_TABLET | Freq: Every evening | ORAL | 1 refills | Status: AC
  Filled 2022-03-26: qty 90, 90d supply, fill #0

## 2022-03-26 MED ORDER — LIOTHYRONINE SODIUM 5 MCG PO TABS
ORAL_TABLET | ORAL | 3 refills | Status: DC
Start: 2022-03-26 — End: 2023-04-13
  Filled 2022-03-26: qty 180, 90d supply, fill #0
  Filled 2022-07-19: qty 180, 90d supply, fill #1
  Filled 2022-10-13: qty 180, 90d supply, fill #2
  Filled 2022-12-26: qty 180, 90d supply, fill #3

## 2022-03-26 MED ORDER — CLONAZEPAM 1 MG PO TABS
ORAL_TABLET | ORAL | 3 refills | Status: DC
  Filled 2022-03-26: qty 45, 30d supply, fill #0
  Filled 2022-05-10: qty 45, 30d supply, fill #1
  Filled 2022-07-19: qty 45, 30d supply, fill #2
  Filled 2022-08-28: qty 45, 30d supply, fill #3

## 2022-04-01 ENCOUNTER — Other Ambulatory Visit (HOSPITAL_COMMUNITY): Payer: Self-pay

## 2022-04-07 ENCOUNTER — Other Ambulatory Visit (HOSPITAL_COMMUNITY): Payer: Self-pay

## 2022-04-10 ENCOUNTER — Encounter (HOSPITAL_BASED_OUTPATIENT_CLINIC_OR_DEPARTMENT_OTHER): Payer: Self-pay

## 2022-04-14 ENCOUNTER — Other Ambulatory Visit: Payer: Self-pay

## 2022-04-14 MED ORDER — ALBUTEROL SULFATE HFA 108 (90 BASE) MCG/ACT IN AERS
INHALATION_SPRAY | RESPIRATORY_TRACT | 3 refills | Status: DC
  Filled 2022-04-14: qty 18, 16d supply, fill #0

## 2022-04-14 MED ORDER — METHOCARBAMOL 500 MG PO TABS
ORAL_TABLET | ORAL | 1 refills | Status: DC
  Filled 2022-04-14: qty 30, 30d supply, fill #0
  Filled 2022-05-10: qty 30, 30d supply, fill #1

## 2022-04-14 MED ORDER — ONDANSETRON HCL 4 MG PO TABS
ORAL_TABLET | ORAL | 1 refills | Status: DC
  Filled 2022-04-14: qty 30, 30d supply, fill #0
  Filled 2022-05-10: qty 30, 30d supply, fill #1

## 2022-04-18 ENCOUNTER — Other Ambulatory Visit (HOSPITAL_COMMUNITY): Payer: Self-pay

## 2022-04-18 MED ORDER — IMVEXXY STARTER PACK 4 MCG VA INST
VAGINAL_INSERT | VAGINAL | 0 refills | Status: AC
  Filled 2022-04-18 – 2022-10-25 (×2): qty 18, 28d supply, fill #0

## 2022-04-23 ENCOUNTER — Other Ambulatory Visit (HOSPITAL_COMMUNITY): Payer: Self-pay

## 2022-04-26 ENCOUNTER — Other Ambulatory Visit (HOSPITAL_COMMUNITY): Payer: Self-pay

## 2022-05-10 ENCOUNTER — Other Ambulatory Visit (HOSPITAL_COMMUNITY): Payer: Self-pay

## 2022-05-13 ENCOUNTER — Other Ambulatory Visit (HOSPITAL_COMMUNITY): Payer: Self-pay

## 2022-05-13 MED ORDER — PREDNISONE 20 MG PO TABS
20.0000 mg | ORAL_TABLET | Freq: Every day | ORAL | 0 refills | Status: AC
  Filled 2022-05-13: qty 7, 7d supply, fill #0

## 2022-05-13 MED ORDER — AZITHROMYCIN 250 MG PO TABS
ORAL_TABLET | ORAL | 0 refills | Status: AC
  Filled 2022-05-13: qty 6, 5d supply, fill #0

## 2022-05-13 MED ORDER — PROMETHAZINE-DM 6.25-15 MG/5ML PO SYRP
5.0000 mL | ORAL_SOLUTION | Freq: Four times a day (QID) | ORAL | 0 refills | Status: DC | PRN
  Filled 2022-05-13: qty 140, 7d supply, fill #0

## 2022-05-28 ENCOUNTER — Telehealth (HOSPITAL_BASED_OUTPATIENT_CLINIC_OR_DEPARTMENT_OTHER): Payer: Self-pay | Admitting: Family

## 2022-05-28 NOTE — Telephone Encounter (Signed)
Spoke with patient regrading Thursday 05/29/22 6:00 pm Calcium scoring appt here at [facility]---arrival time is 5:45 pm ground floor radiology for check in----patient voiced her understanding.

## 2022-05-28 NOTE — Telephone Encounter (Signed)
Left message for patient to call and discuss scheduling the MRA chest ordered by Laurann Montana, NP

## 2022-05-30 ENCOUNTER — Other Ambulatory Visit (HOSPITAL_COMMUNITY): Payer: Self-pay

## 2022-06-02 ENCOUNTER — Other Ambulatory Visit (HOSPITAL_COMMUNITY): Payer: Self-pay

## 2022-06-02 NOTE — Telephone Encounter (Signed)
Left message for patient to call and discuss scheduling the MRA chest ordered by Laurann Montana, NP

## 2022-06-03 NOTE — Telephone Encounter (Signed)
Returning patient's call from earlier today--had to leave voice mail requesting patient call

## 2022-06-04 NOTE — Telephone Encounter (Signed)
Ascending aortic aneurysm - 05/2021 CT chest with ascending aortic aneurysm stable measuring 4.1 cm.  Plan for MRI for next study to reduce contrast exposure - ordered to be completed 05/2022. Avoid fluoroquinolones.   Above from [place] Mam NP last office note  Left message to call back

## 2022-06-04 NOTE — Telephone Encounter (Signed)
Patient was returning phone call 

## 2022-06-05 NOTE — Telephone Encounter (Signed)
Left message for patient to call back  

## 2022-06-06 ENCOUNTER — Telehealth (HOSPITAL_BASED_OUTPATIENT_CLINIC_OR_DEPARTMENT_OTHER): Payer: Self-pay | Admitting: Family

## 2022-06-06 NOTE — Telephone Encounter (Signed)
Left message for patient to call back  

## 2022-06-06 NOTE — Telephone Encounter (Signed)
Left message for patient regarding the Saturday 06/14/22 6:00pm MRA chest appt at [facility]---arrival time is 5:45 pm for check---requested patient call with questions or concerns.

## 2022-06-14 ENCOUNTER — Ambulatory Visit
Admission: RE | Admit: 2022-06-14 | Discharge: 2022-06-14 | Disposition: A | Discharge status: Home / Self Care | Payer: No Typology Code available for payment source | Source: Ambulatory Visit | Attending: Family | Admitting: Family

## 2022-06-14 DIAGNOSIS — I7121 Aneurysm of the ascending aorta, without rupture: Secondary | ICD-10-CM

## 2022-06-14 MED ORDER — GADOBENATE DIMEGLUMINE 529 MG/ML IV SOLN
16.0000 mL | Freq: Once | INTRAVENOUS | Status: AC | PRN
  Administered 2022-06-14: 16 mL via INTRAVENOUS

## 2022-06-16 ENCOUNTER — Telehealth (HOSPITAL_BASED_OUTPATIENT_CLINIC_OR_DEPARTMENT_OTHER): Payer: Self-pay

## 2022-06-16 DIAGNOSIS — I7121 Aneurysm of the ascending aorta, without rupture: Secondary | ICD-10-CM

## 2022-06-16 NOTE — Telephone Encounter (Addendum)
Results called to patient who verbalizes understanding!    ----- Message from Loel Dubonnet, NP sent at 06/15/2022  8:13 PM EDT ----- Stable 4.1 cm ascending thoracic aorta. Repeat MRA in one year for monitoring. Continue optimal BP control.

## 2022-06-27 ENCOUNTER — Other Ambulatory Visit (HOSPITAL_COMMUNITY): Payer: Self-pay

## 2022-06-27 ENCOUNTER — Ambulatory Visit (INDEPENDENT_AMBULATORY_CARE_PROVIDER_SITE_OTHER): Payer: No Typology Code available for payment source | Admitting: Cardiovascular Disease

## 2022-06-27 ENCOUNTER — Encounter (HOSPITAL_BASED_OUTPATIENT_CLINIC_OR_DEPARTMENT_OTHER): Payer: Self-pay | Admitting: Cardiovascular Disease

## 2022-06-27 VITALS — BP 140/84 | HR 82 | Ht 68.5 in | Wt 178.9 lb

## 2022-06-27 DIAGNOSIS — I10 Essential (primary) hypertension: Secondary | ICD-10-CM

## 2022-06-27 DIAGNOSIS — E785 Hyperlipidemia, unspecified: Secondary | ICD-10-CM

## 2022-06-27 DIAGNOSIS — Z720 Tobacco use: Secondary | ICD-10-CM

## 2022-06-27 DIAGNOSIS — I2510 Atherosclerotic heart disease of native coronary artery without angina pectoris: Secondary | ICD-10-CM

## 2022-06-27 DIAGNOSIS — I7121 Aneurysm of the ascending aorta, without rupture: Secondary | ICD-10-CM

## 2022-06-27 DIAGNOSIS — Z5181 Encounter for therapeutic drug level monitoring: Secondary | ICD-10-CM

## 2022-06-27 DIAGNOSIS — R079 Chest pain, unspecified: Secondary | ICD-10-CM | POA: Diagnosis not present

## 2022-06-27 HISTORY — DX: Chest pain, unspecified: R07.9

## 2022-06-27 HISTORY — DX: Essential (primary) hypertension: I10

## 2022-06-27 MED ORDER — LOSARTAN POTASSIUM 50 MG PO TABS
50.0000 mg | ORAL_TABLET | Freq: Every day | ORAL | 3 refills | Status: DC
  Filled 2022-06-27: qty 90, 90d supply, fill #0
  Filled 2022-09-30: qty 90, 90d supply, fill #1
  Filled 2023-01-04: qty 90, 90d supply, fill #2

## 2022-06-27 NOTE — Assessment & Plan Note (Signed)
Felicia Leonard had chest pain at rest for two weeks.  This was in the setting of her infant grandson needing emergency surgery for coarctation of the aorta. She has no exertional chest pain and it no longer occurs now that the stress has passed.

## 2022-06-27 NOTE — Assessment & Plan Note (Signed)
Aorta stable on serial imaging.  4.1 cm on MRI 05/2022.   Continue working on BP control.

## 2022-06-27 NOTE — Patient Instructions (Signed)
Medication Instructions:  INCREASE LOSARTAN TO 50 MG DAILY   *If you need a refill on your cardiac medications before your next appointment, please call your pharmacy*  Lab Work: LPa/BMET IN 1 WEEK   If you have labs (blood work) drawn today and your tests are completely normal, you will receive your results only by: [place] (if you have MyChart) OR A paper copy in the mail If you have any lab test that is abnormal or we need to change your treatment, we will call you to review the results.  Testing/Procedures: MRA CHEST IN 1 YEAR   Follow-Up: At [facility], you and your health needs are our priority.  As part of our continuing mission to provide you with exceptional heart care, we have created designated Provider Care Teams.  These Care Teams include your primary Cardiologist (physician) and Advanced Practice Providers (APPs -  Physician Assistants and Nurse Practitioners) who all work together to provide you with the care you need, when you need it.  We recommend signing up for the patient portal called "MyChart".  Sign up information is provided on this After Visit Summary.  MyChart is used to connect with patients for Virtual Visits (Telemedicine).  Patients are able to view lab/test results, encounter notes, upcoming appointments, etc.  Non-urgent messages can be sent to your provider as well.   To learn more about what you can do with MyChart, go to [email or web address].    Your next appointment:   12 month(s)  The format for your next appointment:   In Person  Provider:   Skeet Latch, MD{  [place] W NP

## 2022-06-27 NOTE — Assessment & Plan Note (Signed)
BP is slightly above goal both initially and on repeat. Increase losartan to '50mg'$ .  Check BMP in a week. She will get her BP tracked at work and bring to follow up.

## 2022-06-27 NOTE — Progress Notes (Signed)
Cardiology Office Note:   Evaluation Performed:  Follow-up visit  Date:  06/29/2022   ID:  Felicia Leonard, DOB [birth year], MRN [id number]  PCP:  Velna Hatchet, MD  Cardiologist:  None  Electrophysiologist:  None   Chief Complaint:  Follow up  History of Present Illness:    Felicia Leonard is a 61 y.o. female with mild ascending aorta aneurysm, coronary calcification, COPD, ongoing tobacco use, hiatal hernia and prior bladder cancer here for follow up.  She was initially seen 11/2018 for the evaluation of coronary calcification.  She had a chest CT 07/2018 that showed a small ascending aorta aneurysm (4.0 cm) and calcification of the LAD.  This has been stable and 2020 and 2021.  She was feeling well at that appointment but had exertional dyspnea and did not get any exercise.  Therefore she was referred for an ETT 12/29/2018 that was negative for ischemia.  She achieved 10.1 METS on a Bruce protocol.  Right lower extremity pulses were diminished and she was referred for ABIs 12/29/2018 that revealed normal blood flow in both legs.    She followed up with Laurann Montana, NP 02/2022 and her blood pressure was elevated. She was started on Losartan. MRA showed that her ascending aortic aneurysm was stable at 4.1 cm. Today, she reports being under considerable stress due to family health issues. She has felt some chest pain which did occur during these stressors. Also she complains of random central abdominal irritation/soreness at times. When she bends over, she describes an intense pain that stops her in her tracks. She states this may be related to a prior hiatal hernia. When she initially started taking losartan, she took it at night and subsequently noticed lightheadedness in the mornings. Currently she is taking her losartan at 11:30 AM and the lightheadedness has not recurred. For activity, she enjoys dancing with her grandchildren. She is frequently climbing up and down stairs. During these  activities she denies any anginal symptoms. Generally she does not complete much formal exercise. At this time she is smoking about 1 ppd. Once she recently went 40 hours without a cigarette. She denies any palpitations, or peripheral edema. No lightheadedness, headaches, syncope, orthopnea, or PND.   Past Medical History:  Diagnosis Date   Aneurysm of ascending aorta ([place]) 12/14/2018   4.0 cm 07/2018.  (followed by dr t. Oval Linsey)   Anxiety    Arthritis    Bladder cancer [facility])    Chest pain of uncertain etiology 11/27/9700   COPD (chronic obstructive pulmonary disease) ([place])    Coronary artery calcification seen on CAT scan    cardiologist-- dr t. Oval Linsey   Depression    Diverticulosis of colon    Essential hypertension 06/27/2022   GERD (gastroesophageal reflux disease)    occasional , no meds   Hemorrhoids    Hiatal hernia    History of anal fissures    History of colon polyps    History of diverticulitis of colon    History of endometriosis    History of exercise stress test    ETT 12-29-2018--- negative for ischemia   History of Helicobacter pylori infection    approx. 2010 per pt   History of recurrent UTIs    "all my childhood"   Hypothyroidism    followed by pcp   Psoriasis    Psoriatic arthritis ([place])    Smokers' cough ([place])    Tobacco abuse 08/27/2020   Varicose veins of both lower extremities  Vitamin D deficiency    Past Surgical History:  Procedure Laterality Date   CESAREAN SECTION  1988   COLONOSCOPY  last one 2018   CYSTOSCOPY W/ RETROGRADES Bilateral 07/21/2019   Procedure: CYSTOSCOPY WITH RETROGRADE PYELOGRAM;  Surgeon: [name], MD;  Location: [facility];  Service: Urology;  Laterality: Bilateral;   CYSTOSCOPY WITH BIOPSY N/A 07/21/2019   Procedure: CYSTOSCOPY WITH BIOPSY FULGURATION WITH INSTILL GEMCITABINE IN PACU;  Surgeon: [name], MD;  Location: [facility];  Service: Urology;  Laterality: N/A;    ESOPHAGOGASTRODUODENOSCOPY  2010   LUMBAR SPINE SURGERY  2005   MICROLARYNGOSCOPY N/A 03/26/2018   Procedure: MICROLARYNGOSCOPY WITH VOCAL CORD BIOPSY;  Surgeon: Melida Quitter, MD;  Location: [place];  Service: ENT;  Laterality: N/A;   TONSILLECTOMY  2009  approx.   TRANSURETHRAL RESECTION OF BLADDER TUMOR  2003   dr Jeffie Pollock   VAGINAL HYSTERECTOMY  2004   w/  Unilateral Salpingo-oophorectomy     Current Meds  Medication Sig   albuterol (VENTOLIN HFA) 108 (90 Base) MCG/ACT inhaler Inhale 1 puff by mouth as needed every 4 hrs   ASPIRIN 81 PO Take by mouth.   betamethasone dipropionate (DIPROLENE) 0.05 % ointment Apply as directed to affected area twice a day Taper use as able.   betamethasone dipropionate 0.05 % lotion Apply liberally to affected area once a day   Betamethasone Valerate 0.12 % foam as needed.   calcipotriene (DOVONOX) 0.005 % cream Apply topically as needed.    CALCIUM PO Take by mouth daily.   cetirizine (ZYRTEC) 10 MG tablet Take 10 mg by mouth at bedtime.    Cholecalciferol (VITAMIN D3) 50 MCG (2000 UT) TABS Take 1 capsule by mouth daily with lunch.   clonazePAM (KLONOPIN) 1 MG tablet TAKE 1 TABLET BY MOUTH AT BEDTIME AS NEEDED--MAY TAKE 1/2 TABLET DAILY IF NEEDED   COVID-19 mRNA Vac-TriS, Pfizer, SUSP injection Inject into the muscle.   Cyanocobalamin (VITAMIN B-12 CR PO) Take 1 tablet by mouth daily.   Estradiol Starter Pack (IMVEXXY STARTER PACK) 4 MCG INST INSERT 1 VAGINAL INSERT (4 MCG) VAGINALLY ONCE DAILY FOR 2 WEEKS THEN 1 INSERT (4 MCG) TWICE WEEKLY FOR DURATION OF USE   hydrocortisone 2.5 % ointment Apply topically to the affected areas of the skin 1 to 2 times a day   levothyroxine (SYNTHROID) 88 MCG tablet TAKE 1 TABLET BY MOUTH ONCE DAILY   liothyronine (CYTOMEL) 5 MCG tablet TAKE 2 TABLETS BY MOUTH DAILY   methocarbamol (ROBAXIN) 500 MG tablet take 1 tablet by mouth daily as needed for pain   mupirocin ointment (BACTROBAN) 2 % Apply 1  application topically 2 (two) times daily until healed.   ondansetron (ZOFRAN) 4 MG tablet Take 4 mg by mouth every 8 (eight) hours as needed for nausea or vomiting.   promethazine-dextromethorphan (PROMETHAZINE-DM) 6.25-15 MG/5ML syrup Take 5 mLs by mouth every 6 (six) hours as needed for cough. Caution may cause sedation.   triamcinolone cream (KENALOG) 0.1 % Apply as directed to affected area twice a day   zolpidem (AMBIEN) 10 MG tablet Take 1 tablet (10 mg total) by mouth at bedtime as needed   [DISCONTINUED] losartan (COZAAR) 25 MG tablet Take 1 tablet (25 mg total) by mouth daily.     Allergies:   Chantix [varenicline], Erythromycin base, Penicillins, Sulfa antibiotics, Sulfasalazine, and Prilosec [omeprazole]   Social History   Tobacco Use   Smoking status: Every Day  Packs/day: 1.00    Years: 25.00    Total pack years: 25.00    Types: Cigarettes   Smokeless tobacco: Never  Vaping Use   Vaping Use: Never used  Substance Use Topics   Alcohol use: No    Alcohol/week: 0.0 standard drinks of alcohol   Drug use: Never     Family Hx: The patient's family history includes Alcohol abuse in her father; Breast cancer in her maternal grandmother; Cancer in her mother; Colon cancer in her mother; Dementia in her maternal grandmother and paternal grandmother; Heart attack in her maternal grandfather; Heart disease in her father; Hypertension in her father; Liver cancer in her mother and paternal uncle; Peripheral Artery Disease in her maternal grandmother; Stroke in her maternal grandmother; Thyroid disease in her father. There is no history of Esophageal cancer, Pancreatic cancer, Rectal cancer, or Stomach cancer.  ROS:   Please see the history of present illness.    (+) Stress (+) Chest pain (+) Abdominal soreness, pain All other systems reviewed and are negative.   Prior CV studies:   The following studies were reviewed today:  MRA Chest  06/14/2022: FINDINGS: Suboptimal  evaluation, secondary to motion degradation.   CARDIOVASCULAR:   Limitations by motion: Moderate   Preferential opacification of the thoracic aorta. No evidence of thoracic aortic dissection.   Conventional 3-sided LEFT aortic arch. No hemodynamic stenosis at the origins of the great vessels.   Aortic Root: Unable to measure.   Thoracic Aorta:   --Ascending Aorta: 4.1 cm   --Aortic Arch: 3.0 cm   --Descending Aorta: 2.7 cm   Other:   Normal heart size.  No pericardial effusion.   Mediastinum/Nodes: No enlarged lymph nodes.   Lungs/Pleura: No pleural effusion.   Upper Abdomen: No acute abnormality.   Musculoskeletal: No chest wall abnormality. No acute or significant osseous findings.   IMPRESSION: VASCULAR   Stable, fusiform aneurysmal dilation of the ascending thoracic aorta measuring 4.1 cm.   Continue annual imaging followup by CTA or MRA. This recommendation follows 2010 ACCF/AHA/AATS/ACR/ASA/SCA/SCAI/SIR/STS/SVM Guidelines for the Diagnosis and Management of Patients with Thoracic Aortic Disease. Circulation. 2010; 121: O709-G283. Aortic aneurysm NOS (ICD10-I71.9)   NON-VASCULAR   No acute MRI findings of the chest.  ETT 12/29/2018:  Blood pressure demonstrated a normal response to exercise. There was no ST segment deviation noted during stress.   ETT with good exercise tolerance (9:00); no chest pain; normal blood pressure response; no ST changes; negative adequate exercise tolerance test; Duke treadmill score 9.  Labs/Other Tests and Data Reviewed:    EKG:  EKG is personally reviewed. 06/27/2022:  EKG was not ordered. 12/14/2018: Sinus rhythm. Rate 80 bpm.  Recent Labs: No results found for requested labs within last 365 days.   Recent Lipid Panel No results found for: "CHOL", "TRIG", "HDL", "CHOLHDL", "LDLCALC", "LDLDIRECT"  Wt Readings from Last 3 Encounters:  06/27/22 178 lb 14.4 oz (81.1 kg)  03/07/22 179 lb 14.4 oz (81.6 kg)  08/27/20 175  lb (79.4 kg)     Objective:    VS:  BP (!) 140/84 (BP Location: Right Arm, Patient Position: Sitting, Cuff Size: Normal)   Pulse 82   Ht 5' 8.5" (1.74 m)   Wt 178 lb 14.4 oz (81.1 kg)   LMP 11/24/2002   SpO2 98%   BMI 26.81 kg/m  , BMI Body mass index is 26.81 kg/m. GENERAL:  Well appearing HEENT: Pupils equal round and reactive, fundi not visualized, oral mucosa unremarkable NECK:  No jugular venous distention, waveform within normal limits, carotid upstroke brisk and symmetric, no bruits, no thyromegaly LUNGS:  Clear to auscultation bilaterally HEART:  RRR.  PMI not displaced or sustained,S1 and S2 within normal limits, no S3, no S4, no clicks, no rubs, III/VI holosystolic murmurs ABD:  Flat, positive bowel sounds normal in frequency in pitch, no bruits, no rebound, no guarding, no midline pulsatile mass, no hepatomegaly, no splenomegaly EXT:  2 plus pulses throughout, no edema, no cyanosis no clubbing SKIN:  No rashes no nodules NEURO:  Cranial nerves II through XII grossly intact, motor grossly intact throughout PSYCH:  Cognitively intact, oriented to person place and time    ASSESSMENT & PLAN:    Aneurysm of ascending aorta ([place]) Aorta stable on serial imaging.  4.1 cm on MRI 05/2022.   Continue working on BP control.  Coronary artery calcification seen on CAT scan Asymptomatic coronary calcification.  Lipids are very well-controlled.  We will check Lp(a).  Continue aspirin.  Lipids are very well controlled.  LDL 48.  Chest pain of uncertain etiology Ms. Gillies had chest pain at rest for two weeks.  This was in the setting of her infant grandson needing emergency surgery for coarctation of the aorta. She has no exertional chest pain and it no longer occurs now that the stress has passed.    Tobacco abuse She continues to smoke about 1ppd.  She has been under a lot of stress.  She is still thinking about quitting but not ready.    Essential hypertension BP is slightly  above goal both initially and on repeat. Increase losartan to '50mg'$ .  Check BMP in a week. She will get her BP tracked at work and bring to follow up.    Medication Adjustments/Labs and Tests Ordered: Current medicines are reviewed at length with the patient today.  Concerns regarding medicines are outlined above.   Tests Ordered: Orders Placed This Encounter  Procedures   Lipoprotein A (LPA)   Basic metabolic panel    Medication Changes: Meds ordered this encounter  Medications   losartan (COZAAR) 50 MG tablet    Sig: Take 1 tablet  by mouth daily.    Dispense:  90 tablet    Refill:  3    NEW DOSE D/C 25 MG RX    Disposition:   FU with APP in 1 month. FU with Nickia Boesen C. Oval Linsey, MD, [facility] in 1 year.  I,Mathew Stumpf,acting as a Education administrator for Skeet Latch, MD.,have documented all relevant documentation on the behalf of Skeet Latch, MD,as directed by  Skeet Latch, MD while in the presence of Skeet Latch, MD.  I, [place] Oval Linsey, MD have reviewed all documentation for this visit.  The documentation of the exam, diagnosis, procedures, and orders on 06/29/2022 are all accurate and complete.   Signed, Skeet Latch, MD  06/29/2022 7:45 AM    [place]

## 2022-06-27 NOTE — Assessment & Plan Note (Addendum)
Asymptomatic coronary calcification.  Lipids are very well-controlled.  We will check Lp(a).  Continue aspirin.  Lipids are very well controlled.  LDL 48.

## 2022-06-27 NOTE — Assessment & Plan Note (Signed)
She continues to smoke about 1ppd.  She has been under a lot of stress.  She is still thinking about quitting but not ready.

## 2022-06-29 ENCOUNTER — Encounter (HOSPITAL_BASED_OUTPATIENT_CLINIC_OR_DEPARTMENT_OTHER): Payer: Self-pay | Admitting: Cardiovascular Disease

## 2022-07-05 LAB — BASIC METABOLIC PANEL
BUN/Creatinine Ratio: 8 — ABNORMAL LOW (ref 12–28)
BUN: 6 mg/dL — ABNORMAL LOW (ref 8–27)
CO2: 20 mmol/L (ref 20–29)
Calcium: 9.3 mg/dL (ref 8.7–10.3)
Chloride: 98 mmol/L (ref 96–106)
Creatinine, Ser: 0.74 mg/dL (ref 0.57–1.00)
Glucose: 100 mg/dL — ABNORMAL HIGH (ref 70–99)
Potassium: 4.3 mmol/L (ref 3.5–5.2)
Sodium: 134 mmol/L (ref 134–144)
eGFR: 93 mL/min/{1.73_m2} (ref >59)

## 2022-07-05 LAB — LIPOPROTEIN A (LPA): Lipoprotein (a): 17.3 nmol/L (ref <75.0)

## 2022-07-10 ENCOUNTER — Other Ambulatory Visit (HOSPITAL_COMMUNITY): Payer: Self-pay

## 2022-07-10 MED ORDER — PREDNISOLONE ACETATE 1 % OP SUSP
OPHTHALMIC | 1 refills | Status: DC
  Filled 2022-07-10: qty 5, 20d supply, fill #0

## 2022-07-21 ENCOUNTER — Other Ambulatory Visit (HOSPITAL_COMMUNITY): Payer: Self-pay

## 2022-07-31 ENCOUNTER — Ambulatory Visit (INDEPENDENT_AMBULATORY_CARE_PROVIDER_SITE_OTHER): Payer: No Typology Code available for payment source | Admitting: Family

## 2022-07-31 ENCOUNTER — Other Ambulatory Visit (HOSPITAL_COMMUNITY): Payer: Self-pay

## 2022-07-31 VITALS — BP 136/80 | Ht 68.5 in | Wt 178.0 lb

## 2022-07-31 DIAGNOSIS — I25118 Atherosclerotic heart disease of native coronary artery with other forms of angina pectoris: Secondary | ICD-10-CM

## 2022-07-31 DIAGNOSIS — E785 Hyperlipidemia, unspecified: Secondary | ICD-10-CM | POA: Diagnosis not present

## 2022-07-31 DIAGNOSIS — I10 Essential (primary) hypertension: Secondary | ICD-10-CM | POA: Diagnosis not present

## 2022-07-31 DIAGNOSIS — Z720 Tobacco use: Secondary | ICD-10-CM | POA: Diagnosis not present

## 2022-07-31 MED ORDER — ROSUVASTATIN CALCIUM 5 MG PO TABS
5.0000 mg | ORAL_TABLET | Freq: Every day | ORAL | 3 refills | Status: DC
  Filled 2022-07-31: qty 90, 90d supply, fill #0
  Filled 2022-10-25 – 2022-11-13 (×2): qty 90, 90d supply, fill #1
  Filled 2022-12-06 – 2023-03-12 (×2): qty 90, 90d supply, fill #2

## 2022-07-31 NOTE — Progress Notes (Signed)
Office Visit    Patient Name: Felicia Leonard Date of Encounter: 08/01/2022  PCP:  Velna Hatchet, [place]  Cardiologist:  Skeet Latch, MD  Advanced Practice Provider:  No care team member to display Electrophysiologist:  None     Chief Complaint    Felicia Leonard is a 61 y.o. female presents today for blood pressure follow up.   Past Medical History    Past Medical History:  Diagnosis Date   Aneurysm of ascending aorta ([place]) 12/14/2018   4.0 cm 07/2018.  (followed by dr t. Oval Linsey)   Anxiety    Arthritis    Bladder cancer [facility])    Chest pain of uncertain etiology 2/0/2542   COPD (chronic obstructive pulmonary disease) ([place])    Coronary artery calcification seen on CAT scan    cardiologist-- dr t. Oval Linsey   Depression    Diverticulosis of colon    Essential hypertension 06/27/2022   GERD (gastroesophageal reflux disease)    occasional , no meds   Hemorrhoids    Hiatal hernia    History of anal fissures    History of colon polyps    History of diverticulitis of colon    History of endometriosis    History of exercise stress test    ETT 12-29-2018--- negative for ischemia   History of Helicobacter pylori infection    approx. 2010 per pt   History of recurrent UTIs    "all my childhood"   Hypothyroidism    followed by pcp   Psoriasis    Psoriatic arthritis ([place])    Smokers' cough ([place])    Tobacco abuse 08/27/2020   Varicose veins of both lower extremities    Vitamin D deficiency    Past Surgical History:  Procedure Laterality Date   CESAREAN SECTION  1988   COLONOSCOPY  last one 2018   CYSTOSCOPY W/ RETROGRADES Bilateral 07/21/2019   Procedure: CYSTOSCOPY WITH RETROGRADE PYELOGRAM;  Surgeon: [name], MD;  Location: [facility];  Service: Urology;  Laterality: Bilateral;   CYSTOSCOPY WITH BIOPSY N/A 07/21/2019   Procedure: CYSTOSCOPY WITH BIOPSY FULGURATION WITH INSTILL GEMCITABINE IN PACU;  Surgeon:  [name], MD;  Location: [facility];  Service: Urology;  Laterality: N/A;   ESOPHAGOGASTRODUODENOSCOPY  2010   LUMBAR SPINE SURGERY  2005   MICROLARYNGOSCOPY N/A 03/26/2018   Procedure: MICROLARYNGOSCOPY WITH VOCAL CORD BIOPSY;  Surgeon: Melida Quitter, MD;  Location: [place];  Service: ENT;  Laterality: N/A;   TONSILLECTOMY  2009  approx.   TRANSURETHRAL RESECTION OF BLADDER TUMOR  2003   dr Jeffie Pollock   VAGINAL HYSTERECTOMY  2004   w/  Unilateral Salpingo-oophorectomy    Allergies  Allergies  Allergen Reactions   Chantix [Varenicline] Hives    Rash on breast    Erythromycin Base Other (See Comments)    UNKNOWN   Penicillins Hives   Sulfa Antibiotics Hives   Sulfasalazine Hives   Prilosec [Omeprazole] Rash    History of Present Illness    Felicia Leonard is a 61 y.o. female with a hx of mild ascending aortic aneurysm, coronary calcification, COPD, ongoing tobacco use, psoriasis, hiatal hernia, prior bladder cancer  last seen 06/27/22.  Evaluated January 2020 for evaluation of coronary calcification.  CT 07/2019 10 with small ascending aortic aneurysm 4.0 cm and calcification of the LAD.  ETT 12/29/2018 negative for ischemia.  She achieved 10 METS on a Bruce protocol.  Right lower extremity  pulses were diminished and she was referred for ABIs 12/2018 that revealed normal blood flow in both legs.  She had surgery on her bladder August 2020 for urothelial carcinoma able to obtain clean margins. CT 05/2021 stable ascending aorta measurement 4.1 cm.  Seen 03/07/22 for follow up and Losartan initiated for elevated BP. Was offered referral to PREP and psychology but politely declined. 05/2022 cardiac MRA with ascending thoracic aorta recommended for repeat MRA in one year.  Seen in follow up 06/27/22 noting considerable stress, chest pain during stress. Losartan increased to '50mg'$  daily.   She presents today for follow up independently. Her grandson is doing much better  and gaining weight. Daughter and grandson both live with her. Reports this has decreased her stress some.   BP at home manually checked by her daughter who is a nurse: 129/75, 136/85, 113/74, 120/80. Reports no chest pain. Stable exertional dyspnea. No orthopnea, PND, edema.   EKGs/Labs/Other Studies Reviewed:   The following studies were reviewed today:  MRA Chest  06/14/2022: FINDINGS: Suboptimal evaluation, secondary to motion degradation.   CARDIOVASCULAR:   Limitations by motion: Moderate   Preferential opacification of the thoracic aorta. No evidence of thoracic aortic dissection.   Conventional 3-sided LEFT aortic arch. No hemodynamic stenosis at the origins of the great vessels.   Aortic Root: Unable to measure.   Thoracic Aorta:   --Ascending Aorta: 4.1 cm   --Aortic Arch: 3.0 cm   --Descending Aorta: 2.7 cm   Other:   Normal heart size.  No pericardial effusion.   Mediastinum/Nodes: No enlarged lymph nodes.   Lungs/Pleura: No pleural effusion.   Upper Abdomen: No acute abnormality.   Musculoskeletal: No chest wall abnormality. No acute or significant osseous findings.   IMPRESSION: VASCULAR   Stable, fusiform aneurysmal dilation of the ascending thoracic aorta measuring 4.1 cm.   Continue annual imaging followup by CTA or MRA. This recommendation follows 2010 ACCF/AHA/AATS/ACR/ASA/SCA/SCAI/SIR/STS/SVM Guidelines for the Diagnosis and Management of Patients with Thoracic Aortic Disease. Circulation. 2010; 121: Z610-R604. Aortic aneurysm NOS (ICD10-I71.9)   NON-VASCULAR   No acute MRI findings of the chest.   CT Chest 05/2021 FINDINGS: Cardiovascular: Atherosclerotic calcification of the aorta and coronary arteries. Ascending aorta measures 4.1 cm. Heart size normal. No pericardial effusion.   Mediastinum/Nodes: No pathologically enlarged mediastinal or axillary lymph nodes. Hilar regions are difficult to definitively evaluate without IV  contrast. Esophagus is grossly unremarkable.   Lungs/Pleura: Centrilobular and paraseptal emphysema. Resolving subpleural consolidation in the posterolateral right lower lobe (5/142). Scattered mucoid impaction. Pulmonary nodules measure up to 4 mm in the right lower lobe (5/93), unchanged. No pleural fluid. Airway is unremarkable.   Upper Abdomen: Visualized portions of the liver, adrenal glands, kidneys, spleen, pancreas, stomach and bowel are grossly unremarkable.   Musculoskeletal: Degenerative changes in the spine. No worrisome lytic or sclerotic lesions.   IMPRESSION: 1. No evidence of metastatic disease. 2. Resolving subpleural consolidation in the posterolateral inferior right lower lobe. 3. Ascending aortic aneurysm, stable. Recommend annual imaging followup by CTA or MRA. This recommendation follows 2010 ACCF/AHA/AATS/ACR/ASA/SCA/SCAI/SIR/STS/SVM Guidelines for the Diagnosis and Management of Patients with Thoracic Aortic Disease. Circulation. 2010; 121: V409-W119. Aortic aneurysm NOS (ICD10-I71.9). 4. Aortic atherosclerosis (ICD10-I70.0). Coronary artery calcification. 5.  Emphysema (ICD10-J43.9).   ETT 12/2018 Blood pressure demonstrated a normal response to exercise. There was no ST segment deviation noted during stress.   ETT with good exercise tolerance (9:00); no chest pain; normal blood pressure response; no ST changes; negative adequate exercise  tolerance test; Duke treadmill score 9.    EKG:  EKG is  ordered today.  The ekg ordered today demonstrates NSR with no acute ST/T wave changes.   Recent Labs: 07/04/2022: BUN 6; Creatinine, Ser 0.74; Potassium 4.3; Sodium 134  Recent Lipid Panel No results found for: "CHOL", "TRIG", "HDL", "CHOLHDL", "VLDL", "LDLCALC", "LDLDIRECT"   Home Medications   Current Meds  Medication Sig   rosuvastatin (CRESTOR) 5 MG tablet Take 1 tablet (5 mg total) by mouth daily.     Review of Systems      All other systems  reviewed and are otherwise negative except as noted above.  Physical Exam    VS:  BP 136/80   Ht 5' 8.5" (1.74 m)   Wt 178 lb (80.7 kg)   LMP 11/24/2002   BMI 26.67 kg/m  , BMI Body mass index is 26.67 kg/m.  Wt Readings from Last 3 Encounters:  07/31/22 178 lb (80.7 kg)  06/27/22 178 lb 14.4 oz (81.1 kg)  03/07/22 179 lb 14.4 oz (81.6 kg)     GEN: Well nourished, well developed, in no acute distress. HEENT: normal. Neck: Supple, no JVD, carotid bruits, or masses. Cardiac: RRR, no murmurs, rubs, or gallops. No clubbing, cyanosis, edema.  Radials/PT 2+ and equal bilaterally.  Respiratory:  Respirations regular and unlabored, clear to auscultation bilaterally. GI: Soft, nontender, nondistended. MS: No deformity or atrophy. Skin: Warm and dry, no rash. Neuro:  Strength and sensation are intact. Psych: Normal affect.  Assessment & Plan    Coronary calcification / HLD -CT with LAD calcification.  ETT negative for ischemia 12/2018. Stable with no anginal symptoms. No indication for ischemic evaluation.  GDMT includes aspirin.  Most recent LDL 48. 07/04/22 Lipoprotein a 17.3. Rx Rosuvastatin '5mg'$  daily for plaque stabilization benefit.   HTN - Elevated BP in clinic but at home manual readings < 130/80. Continue Losartan '50mg'$  QD. Discussed to monitor BP at home at least 2 hours after medications and sitting for 5-10 minutes. If BP consistently >130/80 she will contact us and we will consider further increasing Losartan dose.  LE edema -only noted with travel. Stable compared to previous.  Recommend elevating lower extremities and following low-salt diet.  Tobacco use - Not yet ready to quit. Smoking cessation encouraged. Recommend utilization of [id number].   Hypothyroidiam - Continue to follow with PCP.   Ascending aortic aneurysm - 05/2022 MRA measuring 4.1 cm.  Plan for MRI for next study to reduce contrast exposure - ordered to be completed 05/2022. Avoid fluoroquinolones. Continue  optimal BP control.   Claudication - Normal ABI 12/2018.  No recurrent symptoms.  Disposition: Follow up in 6 month(s) with Skeet Latch, MD or APP.  Signed, Loel Dubonnet, NP 08/01/2022, 6:07 PM [place]

## 2022-07-31 NOTE — Patient Instructions (Addendum)
Medication Instructions:   Your physician has recommended you make the following change in your medication:  START Rosuvastatin '5mg'$  daily *this is to help stabilize the plaque we know is in your heart artery from your previous CT scan *if you want to take every other day for the first 2 weeks then increase to daily, that is fine  *If you need a refill on your cardiac medications before your next appointment, please call your pharmacy*   Lab Work: None ordered today.   Testing/Procedures: None ordered today. Recommend proceeding with MRA 05/2023 as ordered for monitoring of ascending thoracic aorta.   Follow-Up: At [facility], you and your health needs are our priority.  As part of our continuing mission to provide you with exceptional heart care, we have created designated Provider Care Teams.  These Care Teams include your primary Cardiologist (physician) and Advanced Practice Providers (APPs -  Physician Assistants and Nurse Practitioners) who all work together to provide you with the care you need, when you need it.  We recommend signing up for the patient portal called "MyChart".  Sign up information is provided on this After Visit Summary.  MyChart is used to connect with patients for Virtual Visits (Telemedicine).  Patients are able to view lab/test results, encounter notes, upcoming appointments, etc.  Non-urgent messages can be sent to your provider as well.   To learn more about what you can do with MyChart, go to [email or web address].    Your next appointment:   In 6 months with Dr. Oval Linsey or Loel Dubonnet, NP   Other Instructions  Heart Healthy Diet Recommendations: A low-salt diet is recommended. Meats should be grilled, baked, or boiled. Avoid fried foods. Focus on lean protein sources like fish or chicken with vegetables and fruits. The American Heart Association is a Microbiologist!  American Heart Association Diet and Lifeystyle Recommendations    Exercise recommendations: The American Heart Association recommends 150 minutes of moderate intensity exercise weekly. Try 30 minutes of moderate intensity exercise 4-5 times per week. This could include walking, jogging, or swimming.

## 2022-08-01 ENCOUNTER — Other Ambulatory Visit (HOSPITAL_COMMUNITY): Payer: Self-pay

## 2022-08-01 ENCOUNTER — Encounter (HOSPITAL_BASED_OUTPATIENT_CLINIC_OR_DEPARTMENT_OTHER): Payer: Self-pay | Admitting: Family

## 2022-08-29 ENCOUNTER — Other Ambulatory Visit (HOSPITAL_COMMUNITY): Payer: Self-pay

## 2022-09-11 ENCOUNTER — Other Ambulatory Visit: Payer: Self-pay | Admitting: Obstetrics & Gynecology

## 2022-09-11 DIAGNOSIS — M8580 Other specified disorders of bone density and structure, unspecified site: Secondary | ICD-10-CM

## 2022-09-19 ENCOUNTER — Other Ambulatory Visit (HOSPITAL_COMMUNITY): Payer: Self-pay

## 2022-09-22 ENCOUNTER — Other Ambulatory Visit (HOSPITAL_COMMUNITY): Payer: Self-pay

## 2022-09-22 MED ORDER — CLONAZEPAM 1 MG PO TABS
ORAL_TABLET | ORAL | 3 refills | Status: DC
  Filled 2022-09-30: qty 45, 30d supply, fill #0
  Filled 2022-10-25 – 2022-11-15 (×2): qty 45, 30d supply, fill #1
  Filled 2022-12-17: qty 45, 30d supply, fill #2
  Filled 2023-01-30: qty 45, 30d supply, fill #3

## 2022-09-23 ENCOUNTER — Other Ambulatory Visit (HOSPITAL_COMMUNITY): Payer: Self-pay

## 2022-09-30 ENCOUNTER — Other Ambulatory Visit (HOSPITAL_COMMUNITY): Payer: Self-pay

## 2022-10-01 ENCOUNTER — Other Ambulatory Visit (HOSPITAL_COMMUNITY): Payer: Self-pay

## 2022-10-01 MED ORDER — FLUTICASONE PROPIONATE 50 MCG/ACT NA SUSP
NASAL | 1 refills | Status: DC
  Filled 2022-10-01: qty 16, 60d supply, fill #0
  Filled 2022-10-25 – 2023-09-01 (×2): qty 16, 60d supply, fill #1

## 2022-10-01 MED ORDER — ALBUTEROL SULFATE HFA 108 (90 BASE) MCG/ACT IN AERS
INHALATION_SPRAY | RESPIRATORY_TRACT | 0 refills | Status: DC
  Filled 2022-10-01: qty 6.7, 33d supply, fill #0

## 2022-10-08 ENCOUNTER — Other Ambulatory Visit (HOSPITAL_COMMUNITY): Payer: Self-pay

## 2022-10-08 ENCOUNTER — Ambulatory Visit: Payer: No Typology Code available for payment source | Admitting: Physician Assistant

## 2022-10-08 MED ORDER — AZITHROMYCIN 250 MG PO TABS
ORAL_TABLET | ORAL | 0 refills | Status: AC
  Filled 2022-10-08: qty 6, 5d supply, fill #0

## 2022-10-09 ENCOUNTER — Other Ambulatory Visit: Payer: Self-pay | Admitting: Internal Medicine

## 2022-10-09 DIAGNOSIS — R911 Solitary pulmonary nodule: Secondary | ICD-10-CM

## 2022-10-13 ENCOUNTER — Other Ambulatory Visit (HOSPITAL_COMMUNITY): Payer: Self-pay

## 2022-10-14 ENCOUNTER — Ambulatory Visit
Admission: RE | Admit: 2022-10-14 | Discharge: 2022-10-14 | Disposition: A | Discharge status: Home / Self Care | Payer: No Typology Code available for payment source | Source: Ambulatory Visit | Attending: Internal Medicine | Admitting: Internal Medicine

## 2022-10-14 DIAGNOSIS — R911 Solitary pulmonary nodule: Secondary | ICD-10-CM

## 2022-10-25 ENCOUNTER — Other Ambulatory Visit (HOSPITAL_COMMUNITY): Payer: Self-pay

## 2022-10-27 ENCOUNTER — Other Ambulatory Visit (HOSPITAL_COMMUNITY): Payer: Self-pay

## 2022-10-27 MED ORDER — ALBUTEROL SULFATE HFA 108 (90 BASE) MCG/ACT IN AERS
1.0000 | INHALATION_SPRAY | RESPIRATORY_TRACT | 3 refills | Status: AC | PRN
  Filled 2022-10-27: qty 6.7, 25d supply, fill #0
  Filled 2023-09-01: qty 6.7, 30d supply, fill #1

## 2022-10-27 MED ORDER — MUPIROCIN 2 % EX OINT
TOPICAL_OINTMENT | CUTANEOUS | 0 refills | Status: DC
  Filled 2022-10-27: qty 22, 7d supply, fill #0

## 2022-10-28 ENCOUNTER — Other Ambulatory Visit (HOSPITAL_COMMUNITY): Payer: Self-pay

## 2022-10-30 ENCOUNTER — Other Ambulatory Visit (HOSPITAL_COMMUNITY): Payer: Self-pay

## 2022-11-06 ENCOUNTER — Encounter: Payer: Self-pay | Admitting: *Deleted

## 2022-11-13 ENCOUNTER — Other Ambulatory Visit (HOSPITAL_COMMUNITY): Payer: Self-pay

## 2022-11-13 ENCOUNTER — Other Ambulatory Visit: Payer: Self-pay

## 2022-11-18 ENCOUNTER — Other Ambulatory Visit (HOSPITAL_COMMUNITY): Payer: Self-pay

## 2022-11-18 ENCOUNTER — Other Ambulatory Visit: Payer: Self-pay

## 2022-12-06 ENCOUNTER — Other Ambulatory Visit (HOSPITAL_COMMUNITY): Payer: Self-pay

## 2022-12-08 ENCOUNTER — Other Ambulatory Visit (HOSPITAL_COMMUNITY): Payer: Self-pay

## 2022-12-08 MED ORDER — MUPIROCIN 2 % EX OINT
1.0000 | TOPICAL_OINTMENT | CUTANEOUS | 0 refills | Status: DC
  Filled 2022-12-08: qty 22, 10d supply, fill #0

## 2022-12-17 ENCOUNTER — Other Ambulatory Visit: Payer: Self-pay

## 2022-12-17 ENCOUNTER — Other Ambulatory Visit (HOSPITAL_COMMUNITY): Payer: Self-pay

## 2022-12-18 ENCOUNTER — Other Ambulatory Visit: Payer: Self-pay | Admitting: Internal Medicine

## 2022-12-18 DIAGNOSIS — Z1231 Encounter for screening mammogram for malignant neoplasm of breast: Secondary | ICD-10-CM

## 2022-12-19 ENCOUNTER — Other Ambulatory Visit: Payer: Self-pay

## 2022-12-19 ENCOUNTER — Other Ambulatory Visit (HOSPITAL_COMMUNITY): Payer: Self-pay

## 2022-12-19 DIAGNOSIS — N952 Postmenopausal atrophic vaginitis: Secondary | ICD-10-CM | POA: Diagnosis not present

## 2022-12-19 DIAGNOSIS — N760 Acute vaginitis: Secondary | ICD-10-CM | POA: Diagnosis not present

## 2022-12-19 DIAGNOSIS — R300 Dysuria: Secondary | ICD-10-CM | POA: Diagnosis not present

## 2022-12-19 MED ORDER — ESTRADIOL 0.1 MG/GM VA CREA
TOPICAL_CREAM | VAGINAL | 3 refills | Status: AC
  Filled 2022-12-19: qty 42.5, 90d supply, fill #0

## 2022-12-25 ENCOUNTER — Other Ambulatory Visit (HOSPITAL_COMMUNITY): Payer: Self-pay

## 2022-12-26 ENCOUNTER — Other Ambulatory Visit (HOSPITAL_COMMUNITY): Payer: Self-pay

## 2022-12-29 ENCOUNTER — Other Ambulatory Visit (HOSPITAL_COMMUNITY): Payer: Self-pay

## 2022-12-29 DIAGNOSIS — L0390 Cellulitis, unspecified: Secondary | ICD-10-CM | POA: Diagnosis not present

## 2022-12-29 DIAGNOSIS — L02611 Cutaneous abscess of right foot: Secondary | ICD-10-CM | POA: Diagnosis not present

## 2022-12-29 DIAGNOSIS — I10 Essential (primary) hypertension: Secondary | ICD-10-CM | POA: Diagnosis not present

## 2022-12-29 DIAGNOSIS — H5213 Myopia, bilateral: Secondary | ICD-10-CM | POA: Diagnosis not present

## 2022-12-29 MED ORDER — DOXYCYCLINE HYCLATE 100 MG PO TABS
100.0000 mg | ORAL_TABLET | Freq: Two times a day (BID) | ORAL | 0 refills | Status: DC
  Filled 2022-12-29: qty 20, 10d supply, fill #0

## 2022-12-29 MED ORDER — MUPIROCIN 2 % EX OINT
1.0000 | TOPICAL_OINTMENT | Freq: Three times a day (TID) | CUTANEOUS | 0 refills | Status: DC
  Filled 2022-12-29: qty 22, 8d supply, fill #0

## 2023-01-04 ENCOUNTER — Other Ambulatory Visit: Payer: Self-pay

## 2023-01-30 ENCOUNTER — Other Ambulatory Visit (HOSPITAL_COMMUNITY): Payer: Self-pay

## 2023-02-02 ENCOUNTER — Other Ambulatory Visit (HOSPITAL_COMMUNITY): Payer: Self-pay

## 2023-02-02 ENCOUNTER — Other Ambulatory Visit: Payer: Self-pay

## 2023-02-06 ENCOUNTER — Ambulatory Visit
Admission: RE | Admit: 2023-02-06 | Discharge: 2023-02-06 | Disposition: A | Discharge status: Home / Self Care | Payer: 59 | Source: Ambulatory Visit | Attending: Internal Medicine | Admitting: Internal Medicine

## 2023-02-06 DIAGNOSIS — Z1231 Encounter for screening mammogram for malignant neoplasm of breast: Secondary | ICD-10-CM | POA: Diagnosis not present

## 2023-02-10 ENCOUNTER — Encounter (HOSPITAL_BASED_OUTPATIENT_CLINIC_OR_DEPARTMENT_OTHER): Payer: Self-pay | Admitting: Cardiovascular Disease

## 2023-02-10 ENCOUNTER — Ambulatory Visit (HOSPITAL_BASED_OUTPATIENT_CLINIC_OR_DEPARTMENT_OTHER): Payer: 59 | Admitting: Cardiovascular Disease

## 2023-02-10 VITALS — BP 142/82 | HR 84 | Ht 68.5 in | Wt 176.8 lb

## 2023-02-10 DIAGNOSIS — I7121 Aneurysm of the ascending aorta, without rupture: Secondary | ICD-10-CM | POA: Diagnosis not present

## 2023-02-10 DIAGNOSIS — I10 Essential (primary) hypertension: Secondary | ICD-10-CM | POA: Diagnosis not present

## 2023-02-10 DIAGNOSIS — Z5181 Encounter for therapeutic drug level monitoring: Secondary | ICD-10-CM | POA: Diagnosis not present

## 2023-02-10 DIAGNOSIS — I2510 Atherosclerotic heart disease of native coronary artery without angina pectoris: Secondary | ICD-10-CM | POA: Diagnosis not present

## 2023-02-10 MED ORDER — LOSARTAN POTASSIUM-HCTZ 50-12.5 MG PO TABS
1.0000 | ORAL_TABLET | Freq: Every day | ORAL | 3 refills | Status: DC
  Filled 2023-02-10: qty 90, 90d supply, fill #0

## 2023-02-10 NOTE — Patient Instructions (Addendum)
Medication Instructions:  STOP LOSARTAN   START LOSARTAN HCT 50-12.5 MG DAILY   Labwork: BMET IN ABOUT 1 WEEK   Testing/Procedures: NONE  Follow-Up: 12 MONTHS   Any Other Special Instructions Will Be Listed Below (If Applicable).  [email or web address]  If you need a refill on your cardiac medications before your next appointment, please call your pharmacy.

## 2023-02-10 NOTE — Assessment & Plan Note (Addendum)
Stable on repeat imaging.  Blood pressure control as above.

## 2023-02-10 NOTE — Progress Notes (Signed)
Cardiology Office Note:   Evaluation Performed:  Follow-up visit  Date:  03/19/2023   ID:  Felicia Leonard, DOB Feb 17, 1961, MRN [id number]  PCP:  Alysia Penna, MD  Cardiologist:  Chilton Si, MD  Electrophysiologist:  None   Chief Complaint:  Follow up  History of Present Illness:    Felicia Leonard is a 62 y.o. female with mild ascending aorta aneurysm, coronary calcification, COPD, ongoing tobacco use, hiatal hernia and prior bladder cancer here for follow up.  She was initially seen 11/2018 for the evaluation of coronary calcification.  She had a chest CT 07/2018 that showed a small ascending aorta aneurysm (4.0 cm) and calcification of the LAD.  This has been stable and 2020 and 2021.  She was feeling well at that appointment but had exertional dyspnea and did not get any exercise.  Therefore she was referred for an ETT 12/29/2018 that was negative for ischemia.  She achieved 10.1 METS on a Bruce protocol.  Right lower extremity pulses were diminished and she was referred for ABIs 12/29/2018 that revealed normal blood flow in both legs.    She followed up with Gillian Shields, NP 02/2022 and her blood pressure was elevated. She was started on Losartan. MRA showed that her ascending aortic aneurysm was stable at 4.1 cm.  At her visit 8/23 she was experiencing chest pain as a result of her increased stress due to family health issues. She had started taking her losartan in the morning due to having lightheadedness the next morning when she took it at night. Losartan was increased to 50 mg. She was still smoking 1ppd and was exercising by dancing and climbing stairs without any symptoms during activity. She followed up with Gillian Shields NP 07/2022 and was doing well. Home blood pressure was reported 113/74-136/85. Chest pain had resolved. LDL from 8/23 was 48. Rosuvastatin 5 mg started for plaque stabilization.   Today, she reports that she is doing well overall, though she is feeling  fatigued. She has been trying to find more time for herself, but she is working a lot and dealing with some stress from dealing with some of family's health issues. She is getting some exercise with her grand kids and her dog has to be taken outside a lot due to health issues. She does not get any formal exercise. She takes a Klonopin every night to sleep. She is still smoking despite taking two quit smoking classes. Her blood pressure was elevated in office today, 150/92, but this may be due to her rushing to get here. On retake it was 146/82. She denies any palpitations, chest pain or shortness of breath. No lightheadedness, headaches, syncope, orthopnea, or PND.    Past Medical History:  Diagnosis Date   Aneurysm of ascending aorta (HCC) 12/14/2018   4.0 cm 07/2018.  (followed by dr t. [facility] Salvia)   Anxiety    Arthritis    Bladder cancer [facility])    Chest pain of uncertain etiology 06/27/2022   COPD (chronic obstructive pulmonary disease) (HCC)    Coronary artery calcification seen on CAT scan    cardiologist-- dr t. [facility] Salvia   Depression    Diverticulosis of colon    Essential hypertension 06/27/2022   GERD (gastroesophageal reflux disease)    occasional , no meds   Hemorrhoids    Hiatal hernia    History of anal fissures    History of colon polyps    History of diverticulitis of colon    History of endometriosis  History of exercise stress test    ETT 12-29-2018--- negative for ischemia   History of Helicobacter pylori infection    approx. 2010 per pt   History of recurrent UTIs    "all my childhood"   Hypothyroidism    followed by pcp   Psoriasis    Psoriatic arthritis (HCC)    Smokers' cough (HCC)    Tobacco abuse 08/27/2020   Varicose veins of both lower extremities    Vitamin D deficiency    Past Surgical History:  Procedure Laterality Date   CESAREAN SECTION  1988   COLONOSCOPY  last one 2018   CYSTOSCOPY W/ RETROGRADES Bilateral 07/21/2019   Procedure: CYSTOSCOPY WITH  RETROGRADE PYELOGRAM;  Surgeon: Bjorn Pippin, MD;  Location: [facility];  Service: Urology;  Laterality: Bilateral;   CYSTOSCOPY WITH BIOPSY N/A 07/21/2019   Procedure: CYSTOSCOPY WITH BIOPSY FULGURATION WITH INSTILL GEMCITABINE IN PACU;  Surgeon: Bjorn Pippin, MD;  Location: [facility];  Service: Urology;  Laterality: N/A;   ESOPHAGOGASTRODUODENOSCOPY  2010   LUMBAR SPINE SURGERY  2005   MICROLARYNGOSCOPY N/A 03/26/2018   Procedure: MICROLARYNGOSCOPY WITH VOCAL CORD BIOPSY;  Surgeon: Christia Reading, MD;  Location: [facility];  Service: ENT;  Laterality: N/A;   TONSILLECTOMY  2009  approx.   TRANSURETHRAL RESECTION OF BLADDER TUMOR  2003   dr Annabell Howells   VAGINAL HYSTERECTOMY  2004   w/  Unilateral Salpingo-oophorectomy     Current Meds  Medication Sig   albuterol (VENTOLIN HFA) 108 (90 Base) MCG/ACT inhaler Inhale 1 puff by mouth as needed every 4 hrs   albuterol (VENTOLIN HFA) 108 (90 Base) MCG/ACT inhaler Inhale 1 puff into the lungs every 4 (four) hours as needed.   ASPIRIN 81 PO Take by mouth.   betamethasone dipropionate (DIPROLENE) 0.05 % ointment Apply as directed to affected area twice a day Taper use as able.   betamethasone dipropionate 0.05 % lotion Apply liberally to affected area once a day   Betamethasone Valerate 0.12 % foam as needed.   calcipotriene (DOVONOX) 0.005 % cream Apply topically as needed.    CALCIUM PO Take by mouth daily.   cetirizine (ZYRTEC) 10 MG tablet Take 10 mg by mouth at bedtime.    Cholecalciferol (VITAMIN D3) 50 MCG (2000 UT) TABS Take 1 capsule by mouth daily with lunch.   clonazePAM (KLONOPIN) 1 MG tablet TAKE 1 TABLET BY MOUTH AT BEDTIME AS NEEDED--MAY TAKE 1/2 TABLET DAILY IF NEEDED   COVID-19 mRNA Vac-TriS, Pfizer, SUSP injection Inject into the muscle.   Cyanocobalamin (VITAMIN B-12 CR PO) Take 1 tablet by mouth daily.   estradiol (ESTRACE VAGINAL) 0.1 MG/GM vaginal cream Apply 1 applicator full 2 times  weekly, fingertip amount to external vagina   Estradiol Starter Pack (IMVEXXY STARTER PACK) 4 MCG INST INSERT 1 VAGINAL INSERT (4 MCG) VAGINALLY ONCE DAILY FOR 2 WEEKS THEN 1 INSERT (4 MCG) TWICE WEEKLY FOR DURATION OF USE   fluticasone (FLONASE) 50 MCG/ACT nasal spray Instill 1 spray in each nostril once daily   hydrocortisone 2.5 % ointment Apply topically to the affected areas of the skin 1 to 2 times a day   levothyroxine (SYNTHROID) 88 MCG tablet TAKE 1 TABLET BY MOUTH ONCE DAILY   liothyronine (CYTOMEL) 5 MCG tablet TAKE 2 TABLETS BY MOUTH DAILY   losartan-hydrochlorothiazide (HYZAAR) 50-12.5 MG tablet Take 1 tablet by mouth daily.   methocarbamol (ROBAXIN) 500 MG tablet take 1 tablet by mouth daily as needed  for pain   mupirocin ointment (BACTROBAN) 2 % Apply 1 Application topically 2 to 3 times daily for 5 days   ondansetron (ZOFRAN) 4 MG tablet Take 4 mg by mouth every 8 (eight) hours as needed for nausea or vomiting.   prednisoLONE acetate (PRED FORTE) 1 % ophthalmic suspension Apply 1 drop in the affected eye 3 times a day for 10 days , then 2 times a day for  10 days then discontinue   promethazine-dextromethorphan (PROMETHAZINE-DM) 6.25-15 MG/5ML syrup Take 5 mLs by mouth every 6 (six) hours as needed for cough. Caution may cause sedation.   rosuvastatin (CRESTOR) 5 MG tablet Take 1 tablet (5 mg total) by mouth daily.   triamcinolone cream (KENALOG) 0.1 % Apply as directed to affected area twice a day   zolpidem (AMBIEN) 10 MG tablet Take 1 tablet (10 mg total) by mouth at bedtime as needed   [DISCONTINUED] losartan (COZAAR) 50 MG tablet Take 1 tablet  by mouth daily.     Allergies:   Chantix [varenicline], Erythromycin base, Penicillins, Sulfa antibiotics, Sulfasalazine, and Prilosec [omeprazole]   Social History   Tobacco Use   Smoking status: Every Day    Packs/day: 1.00    Years: 25.00    Additional pack years: 0.00    Total pack years: 25.00    Types: Cigarettes    Smokeless tobacco: Never  Vaping Use   Vaping Use: Never used  Substance Use Topics   Alcohol use: No    Alcohol/week: 0.0 standard drinks of alcohol   Drug use: Never     Family Hx: The patient's family history includes Alcohol abuse in her father; Breast cancer in her maternal grandmother; Cancer in her mother; Colon cancer in her mother; Dementia in her maternal grandmother and paternal grandmother; Heart attack in her maternal grandfather; Heart disease in her father; Hypertension in her father; Liver cancer in her mother and paternal uncle; Peripheral Artery Disease in her maternal grandmother; Stroke in her maternal grandmother; Thyroid disease in her father. There is no history of Esophageal cancer, Pancreatic cancer, Rectal cancer, or Stomach cancer.  ROS:   Please see the history of present illness.    (+) fatigue (+) bilateral LE Peripheral edema All other systems reviewed and are negative.   Prior CV studies:   The following studies were reviewed today:  MRA Chest  06/14/2022: IMPRESSION: VASCULAR   Stable, fusiform aneurysmal dilation of the ascending thoracic aorta measuring 4.1 cm.   Continue annual imaging followup by CTA or MRA. This recommendation follows 2010 ACCF/AHA/AATS/ACR/ASA/SCA/SCAI/SIR/STS/SVM Guidelines for the Diagnosis and Management of Patients with Thoracic Aortic Disease. Circulation. 2010; 121: Z610-R604. Aortic aneurysm NOS (ICD10-I71.9)   NON-VASCULAR   No acute MRI findings of the chest.  ETT 12/29/2018:  Blood pressure demonstrated a normal response to exercise. There was no ST segment deviation noted during stress.   ETT with good exercise tolerance (9:00); no chest pain; normal blood pressure response; no ST changes; negative adequate exercise tolerance test; [facility] treadmill score 9.  Labs/Other Tests and Data Reviewed:    EKG:  EKG is personally reviewed. 02/10/2023: Sinus rhythm. Rate 84 bpm 06/27/2022:  EKG was not  ordered. 12/14/2018: Sinus rhythm. Rate 80 bpm.  Recent Labs: 02/17/2023: BUN 5; Creatinine, Ser 0.73; Potassium 3.8; Sodium 130   Recent Lipid Panel No results found for: "CHOL", "TRIG", "HDL", "CHOLHDL", "LDLCALC", "LDLDIRECT"  Wt Readings from Last 3 Encounters:  02/10/23 176 lb 12.8 oz (80.2 kg)  07/31/22 178 lb (80.7  kg)  06/27/22 178 lb 14.4 oz (81.1 kg)     Objective:    VS:  BP (!) 142/82 (BP Location: Right Arm, Patient Position: Sitting, Cuff Size: Normal)   Pulse 84   Ht 5' 8.5" (1.74 m)   Wt 176 lb 12.8 oz (80.2 kg)   LMP 11/24/2002   BMI 26.49 kg/m  , BMI Body mass index is 26.49 kg/m. GENERAL:  Well appearing HEENT: Pupils equal round and reactive, fundi not visualized, oral mucosa unremarkable NECK:  No jugular venous distention, waveform within normal limits, carotid upstroke brisk and symmetric, no bruits, no thyromegaly LUNGS:  Clear to auscultation bilaterally HEART:  RRR.  PMI not displaced or sustained,S1 and S2 within normal limits, no S3, no S4, no clicks, no rubs,   III/VI holosystolic murmurs ABD:  Flat, positive bowel sounds normal in frequency in pitch, no bruits, no rebound, no guarding, no midline pulsatile mass, no hepatomegaly, no splenomegaly EXT:  2 plus pulses throughout,  bilateral LE edema, no cyanosis no clubbing SKIN:  No rashes no nodules NEURO:  Cranial nerves II through XII grossly intact, motor grossly intact throughout PSYCH:  Cognitively intact, oriented to person place and time    ASSESSMENT & PLAN:    Aneurysm of ascending aorta (HCC) Stable on      Medication Adjustments/Labs and Tests Ordered: Current medicines are reviewed at length with the patient today.  Concerns regarding medicines are outlined above.   Tests Ordered: Orders Placed This Encounter  Procedures   Basic metabolic panel   EKG 12-Lead    Medication Changes: Meds ordered this encounter  Medications   losartan-hydrochlorothiazide (HYZAAR) 50-12.5 MG  tablet    Sig: Take 1 tablet by mouth daily.    Dispense:  90 tablet    Refill:  3    D/C LOSARTAN    Disposition:   FU with Alyxandra Tenbrink C. [facility] Salvia, MD, [facility] in 1 year.    I,Jessica Ford,acting as a Neurosurgeon for [employer], MD.,have documented all relevant documentation on the behalf of Chilton Si, MD,as directed by  Chilton Si, MD while in the presence of Chilton Si, MD.   I, Dmoni Fortson C. Duke Salvia, MD have reviewed all documentation for this visit.  The documentation of the exam, diagnosis, procedures, and orders on 03/19/2023 are all accurate and complete.    Signed, Chilton Si, MD  03/19/2023 1:06 PM    [facility]

## 2023-02-11 ENCOUNTER — Other Ambulatory Visit: Payer: Self-pay

## 2023-02-11 ENCOUNTER — Other Ambulatory Visit (HOSPITAL_COMMUNITY): Payer: Self-pay

## 2023-02-17 ENCOUNTER — Telehealth: Payer: Self-pay | Admitting: Cardiovascular Disease

## 2023-02-17 ENCOUNTER — Other Ambulatory Visit (HOSPITAL_BASED_OUTPATIENT_CLINIC_OR_DEPARTMENT_OTHER): Payer: Self-pay | Admitting: Cardiovascular Disease

## 2023-02-17 DIAGNOSIS — I10 Essential (primary) hypertension: Secondary | ICD-10-CM | POA: Diagnosis not present

## 2023-02-17 NOTE — Telephone Encounter (Signed)
Pt c/o medication issue:  1. Name of Medication:   losartan-hydrochlorothiazide (HYZAAR) 50-12.5 MG tablet    2. How are you currently taking this medication (dosage and times per day)? Take 1 tablet by mouth daily.   3. Are you having a reaction (difficulty breathing--STAT)? No  4. What is your medication issue? Pt states that since starting medication she has had swelling and soreness in her left ankle. She also states that last night she was awakened by lag cramps. Pt would a callback regarding this matter

## 2023-02-17 NOTE — Telephone Encounter (Signed)
Returned call to patient, Left message for patient to call back 

## 2023-02-17 NOTE — Telephone Encounter (Signed)
The addition of HCTZ should actually help with swelling. If swelling in only one ankle may be related to injury - ensure no recent falls/injuries. Due for updated labs as ordered in clinic last week. Recommend having collected today as this will assess for electrolyte abnormality which could contribute to cramps.   To prevent or reduce lower extremity swelling: Eat a low salt diet. Salt makes the body hold onto extra fluid which causes swelling. Sit with legs elevated. For example, in the recliner or on an [place].  Wear knee-high compression stockings during the daytime. Ones labeled 15-20 mmHg provide good compression.   Loel Dubonnet, NP

## 2023-02-17 NOTE — Telephone Encounter (Signed)
Returned call to the patient and provided the following recommendations. Patient still has her lab slips, advised to get labs within the next day or so! Patient agreeable to labs.       The addition of HCTZ should actually help with swelling. If swelling in only one ankle may be related to injury - ensure no recent falls/injuries. Due for updated labs as ordered in clinic last week. Recommend having collected today as this will assess for electrolyte abnormality which could contribute to cramps.    To prevent or reduce lower extremity swelling:  Eat a low salt diet. Salt makes the body hold onto extra fluid which causes swelling.  Sit with legs elevated. For example, in the recliner or on an [place].   Wear knee-high compression stockings during the daytime. Ones labeled 15-20 mmHg provide good compression.    Loel Dubonnet, NP

## 2023-02-17 NOTE — Telephone Encounter (Signed)
Pt returning call.   She states she is at work, if she cannot answer, can you leave detailed VM

## 2023-02-18 LAB — BASIC METABOLIC PANEL
BUN/Creatinine Ratio: 7 — ABNORMAL LOW (ref 12–28)
BUN: 5 mg/dL — ABNORMAL LOW (ref 8–27)
CO2: 22 mmol/L (ref 20–29)
Calcium: 9.4 mg/dL (ref 8.7–10.3)
Chloride: 91 mmol/L — ABNORMAL LOW (ref 96–106)
Creatinine, Ser: 0.73 mg/dL (ref 0.57–1.00)
Glucose: 84 mg/dL (ref 70–99)
Potassium: 3.8 mmol/L (ref 3.5–5.2)
Sodium: 130 mmol/L — ABNORMAL LOW (ref 134–144)
eGFR: 94 mL/min/{1.73_m2} (ref >59)

## 2023-03-03 ENCOUNTER — Ambulatory Visit
Admission: RE | Admit: 2023-03-03 | Discharge: 2023-03-03 | Disposition: A | Discharge status: Home / Self Care | Payer: Self-pay | Source: Ambulatory Visit | Attending: Obstetrics & Gynecology | Admitting: Obstetrics & Gynecology

## 2023-03-03 DIAGNOSIS — Z780 Asymptomatic menopausal state: Secondary | ICD-10-CM | POA: Diagnosis not present

## 2023-03-03 DIAGNOSIS — M8580 Other specified disorders of bone density and structure, unspecified site: Secondary | ICD-10-CM

## 2023-03-03 DIAGNOSIS — M85851 Other specified disorders of bone density and structure, right thigh: Secondary | ICD-10-CM | POA: Diagnosis not present

## 2023-03-05 DIAGNOSIS — L400 Psoriasis vulgaris: Secondary | ICD-10-CM | POA: Diagnosis not present

## 2023-03-05 DIAGNOSIS — Z85828 Personal history of other malignant neoplasm of skin: Secondary | ICD-10-CM | POA: Diagnosis not present

## 2023-03-12 ENCOUNTER — Other Ambulatory Visit (HOSPITAL_COMMUNITY): Payer: Self-pay

## 2023-03-13 ENCOUNTER — Other Ambulatory Visit (HOSPITAL_COMMUNITY): Payer: Self-pay

## 2023-03-13 ENCOUNTER — Other Ambulatory Visit: Payer: Self-pay

## 2023-03-13 MED ORDER — CLONAZEPAM 1 MG PO TABS
ORAL_TABLET | ORAL | 3 refills | Status: AC
  Filled 2023-03-13: qty 45, 30d supply, fill #0

## 2023-03-19 ENCOUNTER — Encounter (HOSPITAL_BASED_OUTPATIENT_CLINIC_OR_DEPARTMENT_OTHER): Payer: Self-pay | Admitting: Cardiovascular Disease

## 2023-03-19 NOTE — Assessment & Plan Note (Signed)
She has nonobstructive CAD.  Continue aspirin and rosuvastatin.  LDL goal less than 70.

## 2023-03-19 NOTE — Assessment & Plan Note (Signed)
Blood pressure is uncontrolled.  We will stop her losartan and switch to losartan/HCTZ 50/12.5 mg.  Check a BMP in a week.  Blood pressure goal is less than 130/80.

## 2023-03-24 ENCOUNTER — Encounter: Payer: Self-pay | Admitting: Internal Medicine

## 2023-03-27 DIAGNOSIS — E559 Vitamin D deficiency, unspecified: Secondary | ICD-10-CM | POA: Diagnosis not present

## 2023-03-27 DIAGNOSIS — I10 Essential (primary) hypertension: Secondary | ICD-10-CM | POA: Diagnosis not present

## 2023-03-27 DIAGNOSIS — E039 Hypothyroidism, unspecified: Secondary | ICD-10-CM | POA: Diagnosis not present

## 2023-04-03 ENCOUNTER — Other Ambulatory Visit (HOSPITAL_COMMUNITY): Payer: Self-pay

## 2023-04-03 DIAGNOSIS — R82998 Other abnormal findings in urine: Secondary | ICD-10-CM | POA: Diagnosis not present

## 2023-04-03 DIAGNOSIS — Z1339 Encounter for screening examination for other mental health and behavioral disorders: Secondary | ICD-10-CM | POA: Diagnosis not present

## 2023-04-03 DIAGNOSIS — E039 Hypothyroidism, unspecified: Secondary | ICD-10-CM | POA: Diagnosis not present

## 2023-04-03 DIAGNOSIS — I712 Thoracic aortic aneurysm, without rupture, unspecified: Secondary | ICD-10-CM | POA: Diagnosis not present

## 2023-04-03 DIAGNOSIS — J449 Chronic obstructive pulmonary disease, unspecified: Secondary | ICD-10-CM | POA: Diagnosis not present

## 2023-04-03 DIAGNOSIS — Z0000 Encounter for general adult medical examination without abnormal findings: Secondary | ICD-10-CM | POA: Diagnosis not present

## 2023-04-03 DIAGNOSIS — J45909 Unspecified asthma, uncomplicated: Secondary | ICD-10-CM | POA: Diagnosis not present

## 2023-04-03 DIAGNOSIS — F419 Anxiety disorder, unspecified: Secondary | ICD-10-CM | POA: Diagnosis not present

## 2023-04-03 DIAGNOSIS — I10 Essential (primary) hypertension: Secondary | ICD-10-CM | POA: Diagnosis not present

## 2023-04-03 DIAGNOSIS — Z1331 Encounter for screening for depression: Secondary | ICD-10-CM | POA: Diagnosis not present

## 2023-04-03 DIAGNOSIS — I2510 Atherosclerotic heart disease of native coronary artery without angina pectoris: Secondary | ICD-10-CM | POA: Diagnosis not present

## 2023-04-03 DIAGNOSIS — F17200 Nicotine dependence, unspecified, uncomplicated: Secondary | ICD-10-CM | POA: Diagnosis not present

## 2023-04-03 DIAGNOSIS — C679 Malignant neoplasm of bladder, unspecified: Secondary | ICD-10-CM | POA: Diagnosis not present

## 2023-04-04 ENCOUNTER — Other Ambulatory Visit (HOSPITAL_COMMUNITY): Payer: Self-pay

## 2023-04-06 ENCOUNTER — Other Ambulatory Visit: Payer: Self-pay

## 2023-04-06 ENCOUNTER — Other Ambulatory Visit (HOSPITAL_COMMUNITY): Payer: Self-pay

## 2023-04-06 MED ORDER — LOSARTAN POTASSIUM 50 MG PO TABS
50.0000 mg | ORAL_TABLET | Freq: Every day | ORAL | 1 refills | Status: DC
  Filled 2023-04-06: qty 90, 90d supply, fill #0
  Filled 2023-07-02: qty 90, 90d supply, fill #1

## 2023-04-06 MED ORDER — AMLODIPINE BESYLATE 10 MG PO TABS
10.0000 mg | ORAL_TABLET | Freq: Every day | ORAL | 1 refills | Status: DC
  Filled 2023-04-06: qty 90, 90d supply, fill #0

## 2023-04-07 ENCOUNTER — Encounter: Payer: Self-pay | Admitting: Internal Medicine

## 2023-04-10 ENCOUNTER — Other Ambulatory Visit (HOSPITAL_COMMUNITY): Payer: Self-pay

## 2023-04-13 ENCOUNTER — Other Ambulatory Visit (HOSPITAL_COMMUNITY): Payer: Self-pay

## 2023-04-13 MED ORDER — LIOTHYRONINE SODIUM 5 MCG PO TABS
ORAL_TABLET | ORAL | 3 refills | Status: DC
  Filled 2023-04-13: qty 180, 90d supply, fill #0
  Filled 2023-07-02: qty 180, 90d supply, fill #1
  Filled 2023-10-08: qty 180, 90d supply, fill #2
  Filled 2024-01-06: qty 180, 90d supply, fill #3

## 2023-04-13 MED ORDER — LEVOTHYROXINE SODIUM 88 MCG PO TABS
ORAL_TABLET | ORAL | 3 refills | Status: DC
  Filled 2023-04-13: qty 90, 90d supply, fill #0
  Filled 2023-07-02: qty 90, 90d supply, fill #1
  Filled 2023-10-08: qty 90, 90d supply, fill #2
  Filled 2024-01-06: qty 90, 90d supply, fill #3

## 2023-04-13 MED ORDER — CLONAZEPAM 1 MG PO TABS
1.0000 mg | ORAL_TABLET | Freq: Every evening | ORAL | 3 refills | Status: AC | PRN
  Filled 2023-04-13: qty 45, 30d supply, fill #0
  Filled 2023-05-12: qty 45, 30d supply, fill #1
  Filled 2023-06-09: qty 45, 30d supply, fill #2
  Filled 2023-09-01: qty 45, 30d supply, fill #3

## 2023-04-14 ENCOUNTER — Other Ambulatory Visit: Payer: Self-pay

## 2023-04-14 ENCOUNTER — Other Ambulatory Visit (HOSPITAL_COMMUNITY): Payer: Self-pay

## 2023-04-15 ENCOUNTER — Other Ambulatory Visit (HOSPITAL_COMMUNITY): Payer: Self-pay

## 2023-04-16 ENCOUNTER — Other Ambulatory Visit (HOSPITAL_COMMUNITY): Payer: Self-pay

## 2023-04-17 ENCOUNTER — Other Ambulatory Visit (HOSPITAL_COMMUNITY): Payer: Self-pay

## 2023-04-18 ENCOUNTER — Other Ambulatory Visit (HOSPITAL_COMMUNITY): Payer: Self-pay

## 2023-04-21 ENCOUNTER — Other Ambulatory Visit (HOSPITAL_COMMUNITY): Payer: Self-pay

## 2023-04-23 ENCOUNTER — Other Ambulatory Visit: Payer: Self-pay

## 2023-04-23 ENCOUNTER — Other Ambulatory Visit (HOSPITAL_COMMUNITY): Payer: Self-pay

## 2023-04-23 MED ORDER — DOXYCYCLINE HYCLATE 100 MG PO TABS
100.0000 mg | ORAL_TABLET | Freq: Two times a day (BID) | ORAL | 0 refills | Status: DC
  Filled 2023-04-23: qty 14, 7d supply, fill #0

## 2023-04-24 ENCOUNTER — Other Ambulatory Visit (HOSPITAL_BASED_OUTPATIENT_CLINIC_OR_DEPARTMENT_OTHER): Payer: Self-pay | Admitting: Family

## 2023-04-24 DIAGNOSIS — I700 Atherosclerosis of aorta: Secondary | ICD-10-CM

## 2023-04-24 DIAGNOSIS — I7121 Aneurysm of the ascending aorta, without rupture: Secondary | ICD-10-CM

## 2023-04-29 ENCOUNTER — Other Ambulatory Visit (HOSPITAL_COMMUNITY): Payer: Self-pay

## 2023-05-06 DIAGNOSIS — E039 Hypothyroidism, unspecified: Secondary | ICD-10-CM | POA: Diagnosis not present

## 2023-05-06 DIAGNOSIS — I10 Essential (primary) hypertension: Secondary | ICD-10-CM | POA: Diagnosis not present

## 2023-05-06 DIAGNOSIS — K2100 Gastro-esophageal reflux disease with esophagitis, without bleeding: Secondary | ICD-10-CM | POA: Diagnosis not present

## 2023-05-13 ENCOUNTER — Other Ambulatory Visit: Payer: Self-pay

## 2023-05-13 DIAGNOSIS — I719 Aortic aneurysm of unspecified site, without rupture: Secondary | ICD-10-CM | POA: Insufficient documentation

## 2023-05-13 DIAGNOSIS — L409 Psoriasis, unspecified: Secondary | ICD-10-CM | POA: Insufficient documentation

## 2023-05-13 DIAGNOSIS — C679 Malignant neoplasm of bladder, unspecified: Secondary | ICD-10-CM | POA: Insufficient documentation

## 2023-05-13 DIAGNOSIS — L4050 Arthropathic psoriasis, unspecified: Secondary | ICD-10-CM | POA: Insufficient documentation

## 2023-05-13 DIAGNOSIS — E039 Hypothyroidism, unspecified: Secondary | ICD-10-CM | POA: Insufficient documentation

## 2023-05-13 DIAGNOSIS — Q625 Duplication of ureter: Secondary | ICD-10-CM | POA: Insufficient documentation

## 2023-05-21 ENCOUNTER — Telehealth: Payer: Self-pay | Admitting: Cardiovascular Disease

## 2023-05-21 DIAGNOSIS — E871 Hypo-osmolality and hyponatremia: Secondary | ICD-10-CM

## 2023-05-21 DIAGNOSIS — I10 Essential (primary) hypertension: Secondary | ICD-10-CM

## 2023-05-21 NOTE — Telephone Encounter (Signed)
Spoke with patient regarding medications She saw PCP early May for labs and CPE Dr Ballard Russell d/c Losartan-HCT 50-12.5 mg secondary to low sodium levels Started Losartan 50 mg and Amlodipine 10 mg daily  She developed LE extremity swelling so she stopped and resumed Losartan-HCT about 5 days ago Patient can not come for labs today secondary to having to stay with husband who is recovering from surgery.  She can get labs tomorrow Discussed with Dr Cristal Deer and will have patient get labs tomorrow, resume Amlodipine at 5 mg daily and Losartan 50 mg daily   Advised to monitor blood pressure at home, she stated she was not good at that but could get daughter to do it.  She has not checked her blood pressure since making blood pressure change Requested labs and last ov from PCP

## 2023-05-21 NOTE — Telephone Encounter (Signed)
Pt c/o medication issue:  1. Name of Medication: losartan-hydrochlorothiazide (HYZAAR) 50-12.5 MG tablet   2. How are you currently taking this medication (dosage and times per day)?   3. Are you having a reaction (difficulty breathing--STAT)?   4. What is your medication issue? At PCP doctor labs showed dangerous low level of sodium. Doctor took patient off of medication stated and prescribed pt different medication and swelling started. Patient would like a call back to discuss sodium level and medication.  Restarted this medication a week ago.

## 2023-05-22 ENCOUNTER — Other Ambulatory Visit (HOSPITAL_COMMUNITY): Payer: Self-pay

## 2023-05-22 ENCOUNTER — Other Ambulatory Visit: Payer: Self-pay

## 2023-05-22 ENCOUNTER — Ambulatory Visit (AMBULATORY_SURGERY_CENTER): Payer: 59

## 2023-05-22 VITALS — Ht 68.5 in | Wt 179.0 lb

## 2023-05-22 DIAGNOSIS — I10 Essential (primary) hypertension: Secondary | ICD-10-CM | POA: Diagnosis not present

## 2023-05-22 DIAGNOSIS — Z86010 Personal history of colonic polyps: Secondary | ICD-10-CM

## 2023-05-22 DIAGNOSIS — E871 Hypo-osmolality and hyponatremia: Secondary | ICD-10-CM | POA: Diagnosis not present

## 2023-05-22 MED ORDER — SUTAB 1479-225-188 MG PO TABS
12.0000 | ORAL_TABLET | ORAL | 0 refills | Status: DC
  Filled 2023-05-22: qty 24, 2d supply, fill #0

## 2023-05-22 NOTE — Telephone Encounter (Signed)
Agree with plan as provided. Will make additional recommendations when lab results available Monday.   Alver Sorrow, NP

## 2023-05-22 NOTE — Progress Notes (Signed)
No egg or soy allergy known to patient  No issues known to pt with past sedation with any surgeries or procedures Patient denies ever being told they had issues or difficulty with intubation  No FH of Malignant Hyperthermia Pt is not on diet pills Pt is not on  home 02  Pt is not on blood thinners  Pt denies issues with constipation  No A fib or A flutter Have any cardiac testing pending-- no  LOA: independent  Prep: SUTAB  Patient's chart reviewed by Cathlyn Parsons CNRA prior to previsit and patient appropriate for the [facility].  Previsit completed and red dot placed by patient's name on their procedure day (on provider's schedule).     PV competed with patient. Prep instructions sent via mychart and home address.

## 2023-05-23 LAB — BASIC METABOLIC PANEL
BUN/Creatinine Ratio: 10 — ABNORMAL LOW (ref 12–28)
BUN: 7 mg/dL — ABNORMAL LOW (ref 8–27)
CO2: 22 mmol/L (ref 20–29)
Calcium: 9.3 mg/dL (ref 8.7–10.3)
Chloride: 95 mmol/L — ABNORMAL LOW (ref 96–106)
Creatinine, Ser: 0.71 mg/dL (ref 0.57–1.00)
Glucose: 97 mg/dL (ref 70–99)
Potassium: 4.1 mmol/L (ref 3.5–5.2)
Sodium: 129 mmol/L — ABNORMAL LOW (ref 134–144)
eGFR: 97 mL/min/{1.73_m2} (ref >59)

## 2023-05-25 ENCOUNTER — Other Ambulatory Visit: Payer: Self-pay

## 2023-05-25 NOTE — Telephone Encounter (Signed)
Addressed via result note. Na 129 indicative of hyponatremia - recommended to remain off hydrochlorothiazide.   Alver Sorrow, NP

## 2023-06-02 ENCOUNTER — Encounter: Payer: Self-pay | Admitting: Internal Medicine

## 2023-06-02 DIAGNOSIS — Z01419 Encounter for gynecological examination (general) (routine) without abnormal findings: Secondary | ICD-10-CM | POA: Diagnosis not present

## 2023-06-02 DIAGNOSIS — Z6826 Body mass index (BMI) 26.0-26.9, adult: Secondary | ICD-10-CM | POA: Diagnosis not present

## 2023-06-08 ENCOUNTER — Ambulatory Visit (AMBULATORY_SURGERY_CENTER): Payer: 59 | Admitting: Internal Medicine

## 2023-06-08 ENCOUNTER — Encounter: Payer: Self-pay | Admitting: Internal Medicine

## 2023-06-08 VITALS — BP 121/76 | HR 69 | Temp 98.6°F | Resp 18 | Ht 68.5 in | Wt 179.0 lb

## 2023-06-08 DIAGNOSIS — D123 Benign neoplasm of transverse colon: Secondary | ICD-10-CM

## 2023-06-08 DIAGNOSIS — E669 Obesity, unspecified: Secondary | ICD-10-CM | POA: Diagnosis not present

## 2023-06-08 DIAGNOSIS — Z09 Encounter for follow-up examination after completed treatment for conditions other than malignant neoplasm: Secondary | ICD-10-CM | POA: Diagnosis not present

## 2023-06-08 DIAGNOSIS — Z1211 Encounter for screening for malignant neoplasm of colon: Secondary | ICD-10-CM

## 2023-06-08 DIAGNOSIS — J449 Chronic obstructive pulmonary disease, unspecified: Secondary | ICD-10-CM | POA: Diagnosis not present

## 2023-06-08 DIAGNOSIS — Z86010 Personal history of colonic polyps: Secondary | ICD-10-CM | POA: Diagnosis not present

## 2023-06-08 DIAGNOSIS — I10 Essential (primary) hypertension: Secondary | ICD-10-CM | POA: Diagnosis not present

## 2023-06-08 MED ORDER — SODIUM CHLORIDE 0.9 % IV SOLN: 500.0000 mL | Freq: Once | INTRAVENOUS | Status: AC

## 2023-06-08 NOTE — Progress Notes (Signed)
 Called to room to assist during endoscopic procedure.  Patient ID and intended procedure confirmed with present staff. Received instructions for my participation in the procedure from the performing physician.  

## 2023-06-08 NOTE — Progress Notes (Signed)
 Pt's states no medical or surgical changes since previsit or office visit. 

## 2023-06-08 NOTE — Patient Instructions (Signed)
 Discharge instructions given. Handouts on polyps,diverticulosis and Hemorrhoids. Resume previous medications. YOU HAD AN ENDOSCOPIC PROCEDURE TODAY AT THE [facility]:   Refer to the procedure report that was given to you for any specific questions about what was found during the examination.  If the procedure report does not answer your questions, please call your gastroenterologist to clarify.  If you requested that your care partner not be given the details of your procedure findings, then the procedure report has been included in a sealed envelope for you to review at your convenience later.  YOU SHOULD EXPECT: Some feelings of bloating in the abdomen. Passage of more gas than usual.  Walking can help get rid of the air that was put into your GI tract during the procedure and reduce the bloating. If you had a lower endoscopy (such as a colonoscopy or flexible sigmoidoscopy) you may notice spotting of blood in your stool or on the toilet paper. If you underwent a bowel prep for your procedure, you may not have a normal bowel movement for a few days.  Please Note:  You might notice some irritation and congestion in your nose or some drainage.  This is from the oxygen used during your procedure.  There is no need for concern and it should clear up in a day or so.  SYMPTOMS TO REPORT IMMEDIATELY:  Following lower endoscopy (colonoscopy or flexible sigmoidoscopy):  Excessive amounts of blood in the stool  Significant tenderness or worsening of abdominal pains  Swelling of the abdomen that is new, acute  Fever of 100F or higher   For urgent or emergent issues, a gastroenterologist can be reached at any hour by calling [phone]. Do not use MyChart messaging for urgent concerns.    DIET:  We do recommend a small meal at first, but then you may proceed to your regular diet.  Drink plenty of fluids but you should avoid alcoholic beverages for 24 hours.  ACTIVITY:  You should  plan to take it easy for the rest of today and you should NOT DRIVE or use heavy machinery until tomorrow (because of the sedation medicines used during the test).    FOLLOW UP: Our staff will call the number listed on your records the next business day following your procedure.  We will call around 7:15- 8:00 am to check on you and address any questions or concerns that you may have regarding the information given to you following your procedure. If we do not reach you, we will leave a message.     If any biopsies were taken you will be contacted by phone or by letter within the next 1-3 weeks.  Please call us at [phone] if you have not heard about the biopsies in 3 weeks.    SIGNATURES/CONFIDENTIALITY: You and/or your care partner have signed paperwork which will be entered into your electronic medical record.  These signatures attest to the fact that that the information above on your After Visit Summary has been reviewed and is understood.  Full responsibility of the confidentiality of this discharge information lies with you and/or your care-partner. 

## 2023-06-08 NOTE — Progress Notes (Signed)
HISTORY OF PRESENT ILLNESS:  Felicia Leonard is a 62 y.o. female with a family history of colon cancer and personal history of adenomatous colon polyp.  Now for colonoscopy.  Last exam 2019  REVIEW OF SYSTEMS:  All non-GI ROS negative except for  Past Medical History:  Diagnosis Date   Aneurysm of ascending aorta (HCC) 12/14/2018   4.0 cm 07/2018.  (followed by dr t. Duke Salvia)   Anxiety    Arthritis    Bladder cancer [facility])    Chest pain of uncertain etiology 06/27/2022   COPD (chronic obstructive pulmonary disease) (HCC)    Coronary artery calcification seen on CAT scan    cardiologist-- dr t. Duke Salvia   Depression    Diverticulosis of colon    Essential hypertension 06/27/2022   GERD (gastroesophageal reflux disease)    occasional , no meds   Hemorrhoids    Hiatal hernia    History of anal fissures    History of colon polyps    History of diverticulitis of colon    History of endometriosis    History of exercise stress test    ETT 12-29-2018--- negative for ischemia   History of Helicobacter pylori infection    approx. 2010 per pt   History of recurrent UTIs    "all my childhood"   Hypothyroidism    followed by pcp   Psoriasis    Psoriatic arthritis (HCC)    Smokers' cough (HCC)    Tobacco abuse 08/27/2020   Varicose veins of both lower extremities    Vitamin D deficiency     Past Surgical History:  Procedure Laterality Date   CESAREAN SECTION  1988   COLONOSCOPY  last one 2018   CYSTOSCOPY W/ RETROGRADES Bilateral 07/21/2019   Procedure: CYSTOSCOPY WITH RETROGRADE PYELOGRAM;  Surgeon: Bjorn Pippin, MD;  Location: [facility];  Service: Urology;  Laterality: Bilateral;   CYSTOSCOPY WITH BIOPSY N/A 07/21/2019   Procedure: CYSTOSCOPY WITH BIOPSY FULGURATION WITH INSTILL GEMCITABINE IN PACU;  Surgeon: Bjorn Pippin, MD;  Location: [facility];  Service: Urology;  Laterality: N/A;   ESOPHAGOGASTRODUODENOSCOPY  2010   LUMBAR SPINE SURGERY  2005    MICROLARYNGOSCOPY N/A 03/26/2018   Procedure: MICROLARYNGOSCOPY WITH VOCAL CORD BIOPSY;  Surgeon: Christia Reading, MD;  Location: [facility];  Service: ENT;  Laterality: N/A;   TONSILLECTOMY  2009  approx.   TRANSURETHRAL RESECTION OF BLADDER TUMOR  2003   dr Annabell Howells   VAGINAL HYSTERECTOMY  2004   w/  Unilateral Salpingo-oophorectomy    Social History [name]  reports that she has been smoking cigarettes. She has a 25 pack-year smoking history. She has never used smokeless tobacco. She reports that she does not drink alcohol and does not use drugs.  family history includes Alcohol abuse in her father; Breast cancer in her maternal grandmother; Cancer in her mother; Colon cancer in her mother; Dementia in her maternal grandmother and paternal grandmother; Heart attack in her maternal grandfather; Heart disease in her father; Hypertension in her father; Liver cancer in her mother and paternal uncle; Peripheral Artery Disease in her maternal grandmother; Stroke in her maternal grandmother; Thyroid disease in her father.  Allergies  Allergen Reactions   Amoxicillin-Pot Clavulanate     Other Reaction(s): Not available   Chantix [Varenicline] Hives    Rash on breast    Erythromycin Base Other (See Comments)    UNKNOWN   Penicillins Hives   Sulfa Antibiotics Hives   Sulfasalazine Hives  Prilosec [Omeprazole] Rash       PHYSICAL EXAMINATION: Vital signs: BP 105/68   Pulse 79   Temp 98.6 F (37 C)   Ht 5' 8.5" (1.74 m)   Wt 179 lb (81.2 kg)   LMP 11/24/2002   SpO2 97%   BMI 26.82 kg/m  General: Well-developed, well-nourished, no acute distress HEENT: Sclerae are anicteric, conjunctiva pink. Oral mucosa intact Lungs: Clear Heart: Regular Abdomen: soft, nontender, nondistended, no obvious ascites, no peritoneal signs, normal bowel sounds. No organomegaly. Extremities: No edema Psychiatric: alert and oriented x3. Cooperative     ASSESSMENT:  Family history  of colon cancer in mother less than 21 Personal history of tubular adenoma  PLAN:  Surveillance colonoscopy

## 2023-06-08 NOTE — Op Note (Signed)
[facility] Patient Name: Felicia Leonard Procedure Date: 06/08/2023 10:05 AM MRN: [id number] Endoscopist: Wilhemina Bonito. Marina Goodell , MD, [id number] Age: 62 Referring MD:  Date of Birth: 10/09/1961 Gender: Female Account #: [id number] Procedure:                Colonoscopy with cold snare polypectomy x 1 Indications:              High risk colon cancer surveillance: Personal                            history of non-advanced adenoma. Also, family                            history of colon cancer in mother less than age 21.                            Previous examinations in [place] and here. Most                            recently 2019 Medicines:                Monitored Anesthesia Care Procedure:                Pre-Anesthesia Assessment:                           - Prior to the procedure, a History and Physical                            was performed, and patient medications and                            allergies were reviewed. The patient's tolerance of                            previous anesthesia was also reviewed. The risks                            and benefits of the procedure and the sedation                            options and risks were discussed with the patient.                            All questions were answered, and informed consent                            was obtained. Prior Anticoagulants: The patient has                            taken no anticoagulant or antiplatelet agents. ASA                            Grade Assessment: II - A patient with mild systemic  disease. After reviewing the risks and benefits,                            the patient was deemed in satisfactory condition to                            undergo the procedure.                           After obtaining informed consent, the colonoscope                            was passed under direct vision. Throughout the                            procedure, the  patient's blood pressure, pulse, and                            oxygen saturations were monitored continuously. The                            Olympus CF-HQ190L ([id number]) Colonoscope was                            introduced through the anus and advanced to the the                            cecum, identified by appendiceal orifice and                            ileocecal valve. The ileocecal valve, appendiceal                            orifice, and rectum were photographed. The quality                            of the bowel preparation was excellent. The                            colonoscopy was performed without difficulty. The                            patient tolerated the procedure well. The bowel                            preparation used was SUTAB via split dose                            instruction. Scope In: 10:17:00 AM Scope Out: 10:28:38 AM Scope Withdrawal Time: 0 hours 7 minutes 42 seconds  Total Procedure Duration: 0 hours 11 minutes 38 seconds  Findings:                 A 4 mm polyp was found in the transverse colon. The  polyp was removed with a cold snare. Resection and                            retrieval were complete.                           Multiple diverticula were found in the sigmoid                            colon.                           Internal hemorrhoids were found during retroflexion.                           The exam was otherwise without abnormality on                            direct and retroflexion views. Complications:            No immediate complications. Estimated blood loss:                            None. Estimated Blood Loss:     Estimated blood loss: none. Impression:               - One 4 mm polyp in the transverse colon, removed                            with a cold snare. Resected and retrieved.                           - Diverticulosis in the sigmoid colon.                           - Internal  hemorrhoids.                           - The examination was otherwise normal on direct                            and retroflexion views. Recommendation:           - Repeat colonoscopy in 5 years for surveillance                            (personal and family history).                           - Patient has a contact number available for                            emergencies. The signs and symptoms of potential                            delayed complications were discussed with the  patient. Return to normal activities tomorrow.                            Written discharge instructions were provided to the                            patient.                           - Resume previous diet.                           - Continue present medications.                           - Await pathology results. Wilhemina Bonito. Marina Goodell, MD 06/08/2023 10:33:13 AM This report has been signed electronically.

## 2023-06-08 NOTE — Progress Notes (Signed)
Sedate, gd SR, tolerated procedure well, VSS, report to RN 

## 2023-06-09 ENCOUNTER — Other Ambulatory Visit (HOSPITAL_COMMUNITY): Payer: Self-pay

## 2023-06-09 ENCOUNTER — Telehealth: Payer: Self-pay

## 2023-06-09 ENCOUNTER — Other Ambulatory Visit: Payer: Self-pay

## 2023-06-09 MED ORDER — ZOLPIDEM TARTRATE 10 MG PO TABS
10.0000 mg | ORAL_TABLET | Freq: Every day | ORAL | 1 refills | Status: AC
  Filled 2023-06-12: qty 90, 90d supply, fill #0
  Filled 2023-09-01: qty 90, 90d supply, fill #1

## 2023-06-09 MED ORDER — ALBUTEROL SULFATE HFA 108 (90 BASE) MCG/ACT IN AERS
1.0000 | INHALATION_SPRAY | RESPIRATORY_TRACT | 3 refills | Status: DC | PRN
  Filled 2023-06-09: qty 6.7, 30d supply, fill #0

## 2023-06-09 MED ORDER — METHOCARBAMOL 500 MG PO TABS
500.0000 mg | ORAL_TABLET | Freq: Every day | ORAL | 1 refills | Status: AC | PRN
  Filled 2023-06-09 – 2023-06-12 (×2): qty 30, 30d supply, fill #0
  Filled 2023-09-01: qty 30, 30d supply, fill #1

## 2023-06-09 NOTE — Telephone Encounter (Signed)
Follow up call to pt, lm for pt to call if having any difficulty with normal activities or eating and drinking.  Also to call if any other questions or concerns.  

## 2023-06-10 ENCOUNTER — Other Ambulatory Visit (HOSPITAL_COMMUNITY): Payer: Self-pay

## 2023-06-10 ENCOUNTER — Encounter: Payer: Self-pay | Admitting: Internal Medicine

## 2023-06-12 ENCOUNTER — Other Ambulatory Visit: Payer: Self-pay

## 2023-06-16 ENCOUNTER — Encounter (HOSPITAL_BASED_OUTPATIENT_CLINIC_OR_DEPARTMENT_OTHER): Payer: Self-pay

## 2023-06-19 ENCOUNTER — Ambulatory Visit (HOSPITAL_BASED_OUTPATIENT_CLINIC_OR_DEPARTMENT_OTHER): Admission: RE | Admit: 2023-06-19 | Payer: 59 | Source: Ambulatory Visit

## 2023-07-02 ENCOUNTER — Other Ambulatory Visit (HOSPITAL_COMMUNITY): Payer: Self-pay

## 2023-07-02 MED ORDER — AMLODIPINE BESYLATE 10 MG PO TABS
10.0000 mg | ORAL_TABLET | Freq: Every day | ORAL | 1 refills | Status: AC
  Filled 2023-07-02 – 2023-09-27 (×2): qty 90, 90d supply, fill #0
  Filled 2023-12-15: qty 90, 90d supply, fill #1

## 2023-07-02 MED ORDER — AMLODIPINE BESYLATE 10 MG PO TABS
10.0000 mg | ORAL_TABLET | Freq: Every day | ORAL | 1 refills | Status: DC
  Filled 2023-07-02: qty 90, 90d supply, fill #0
  Filled 2024-04-04: qty 90, 90d supply, fill #1

## 2023-07-08 ENCOUNTER — Ambulatory Visit (HOSPITAL_COMMUNITY)
Admission: RE | Admit: 2023-07-08 | Discharge: 2023-07-08 | Disposition: A | Discharge status: Home / Self Care | Payer: 59 | Source: Ambulatory Visit | Attending: Family | Admitting: Family

## 2023-07-08 DIAGNOSIS — I700 Atherosclerosis of aorta: Secondary | ICD-10-CM | POA: Diagnosis not present

## 2023-07-08 DIAGNOSIS — I7121 Aneurysm of the ascending aorta, without rupture: Secondary | ICD-10-CM | POA: Diagnosis not present

## 2023-07-08 MED ORDER — GADOBUTROL 1 MMOL/ML IV SOLN
8.0000 mL | Freq: Once | INTRAVENOUS | Status: AC | PRN
  Administered 2023-07-08: 8 mL via INTRAVENOUS

## 2023-07-13 ENCOUNTER — Other Ambulatory Visit (HOSPITAL_BASED_OUTPATIENT_CLINIC_OR_DEPARTMENT_OTHER): Payer: Self-pay

## 2023-07-13 DIAGNOSIS — I7121 Aneurysm of the ascending aorta, without rupture: Secondary | ICD-10-CM

## 2023-07-17 DIAGNOSIS — Z8551 Personal history of malignant neoplasm of bladder: Secondary | ICD-10-CM | POA: Diagnosis not present

## 2023-09-01 ENCOUNTER — Other Ambulatory Visit (HOSPITAL_COMMUNITY): Payer: Self-pay

## 2023-09-02 ENCOUNTER — Other Ambulatory Visit (HOSPITAL_COMMUNITY): Payer: Self-pay

## 2023-09-02 ENCOUNTER — Other Ambulatory Visit: Payer: Self-pay

## 2023-09-02 MED ORDER — MUPIROCIN 2 % EX OINT
TOPICAL_OINTMENT | CUTANEOUS | 0 refills | Status: DC
  Filled 2023-09-02: qty 22, 5d supply, fill #0

## 2023-09-15 ENCOUNTER — Other Ambulatory Visit (HOSPITAL_COMMUNITY): Payer: Self-pay

## 2023-09-15 DIAGNOSIS — H9201 Otalgia, right ear: Secondary | ICD-10-CM | POA: Diagnosis not present

## 2023-09-15 DIAGNOSIS — Z1152 Encounter for screening for COVID-19: Secondary | ICD-10-CM | POA: Diagnosis not present

## 2023-09-15 DIAGNOSIS — R062 Wheezing: Secondary | ICD-10-CM | POA: Diagnosis not present

## 2023-09-15 DIAGNOSIS — R0981 Nasal congestion: Secondary | ICD-10-CM | POA: Diagnosis not present

## 2023-09-15 DIAGNOSIS — J209 Acute bronchitis, unspecified: Secondary | ICD-10-CM | POA: Diagnosis not present

## 2023-09-15 DIAGNOSIS — F17200 Nicotine dependence, unspecified, uncomplicated: Secondary | ICD-10-CM | POA: Diagnosis not present

## 2023-09-15 DIAGNOSIS — J449 Chronic obstructive pulmonary disease, unspecified: Secondary | ICD-10-CM | POA: Diagnosis not present

## 2023-09-15 MED ORDER — AZITHROMYCIN 250 MG PO TABS
ORAL_TABLET | ORAL | 0 refills | Status: AC
  Filled 2023-09-15: qty 6, 5d supply, fill #0

## 2023-09-15 MED ORDER — PREDNISONE 20 MG PO TABS
20.0000 mg | ORAL_TABLET | Freq: Two times a day (BID) | ORAL | 0 refills | Status: AC
  Filled 2023-09-15: qty 10, 5d supply, fill #0

## 2023-09-27 ENCOUNTER — Other Ambulatory Visit (HOSPITAL_COMMUNITY): Payer: Self-pay

## 2023-09-28 ENCOUNTER — Other Ambulatory Visit (HOSPITAL_COMMUNITY): Payer: Self-pay

## 2023-09-28 ENCOUNTER — Other Ambulatory Visit: Payer: Self-pay

## 2023-09-28 MED ORDER — CLONAZEPAM 1 MG PO TABS
1.5000 mg | ORAL_TABLET | Freq: Every day | ORAL | 0 refills | Status: DC | PRN
  Filled 2023-09-28 – 2023-11-07 (×2): qty 45, 30d supply, fill #0

## 2023-09-28 MED ORDER — LOSARTAN POTASSIUM 50 MG PO TABS
50.0000 mg | ORAL_TABLET | Freq: Every day | ORAL | 1 refills | Status: DC
  Filled 2023-09-28: qty 90, 90d supply, fill #0
  Filled 2023-12-28: qty 90, 90d supply, fill #1

## 2023-10-08 ENCOUNTER — Other Ambulatory Visit (HOSPITAL_COMMUNITY): Payer: Self-pay

## 2023-10-08 ENCOUNTER — Other Ambulatory Visit (HOSPITAL_BASED_OUTPATIENT_CLINIC_OR_DEPARTMENT_OTHER): Payer: Self-pay | Admitting: Family

## 2023-10-08 DIAGNOSIS — E785 Hyperlipidemia, unspecified: Secondary | ICD-10-CM

## 2023-10-08 DIAGNOSIS — I25118 Atherosclerotic heart disease of native coronary artery with other forms of angina pectoris: Secondary | ICD-10-CM

## 2023-10-08 MED ORDER — ROSUVASTATIN CALCIUM 5 MG PO TABS
5.0000 mg | ORAL_TABLET | Freq: Every day | ORAL | 3 refills | Status: DC
  Filled 2023-10-08: qty 90, 90d supply, fill #0
  Filled 2024-02-06: qty 90, 90d supply, fill #1

## 2023-10-09 ENCOUNTER — Other Ambulatory Visit (HOSPITAL_COMMUNITY): Payer: Self-pay

## 2023-10-09 DIAGNOSIS — J45909 Unspecified asthma, uncomplicated: Secondary | ICD-10-CM | POA: Diagnosis not present

## 2023-10-09 DIAGNOSIS — I872 Venous insufficiency (chronic) (peripheral): Secondary | ICD-10-CM | POA: Diagnosis not present

## 2023-10-09 DIAGNOSIS — R911 Solitary pulmonary nodule: Secondary | ICD-10-CM | POA: Diagnosis not present

## 2023-10-09 DIAGNOSIS — J449 Chronic obstructive pulmonary disease, unspecified: Secondary | ICD-10-CM | POA: Diagnosis not present

## 2023-10-09 DIAGNOSIS — I712 Thoracic aortic aneurysm, without rupture, unspecified: Secondary | ICD-10-CM | POA: Diagnosis not present

## 2023-10-09 DIAGNOSIS — F17200 Nicotine dependence, unspecified, uncomplicated: Secondary | ICD-10-CM | POA: Diagnosis not present

## 2023-10-09 DIAGNOSIS — E039 Hypothyroidism, unspecified: Secondary | ICD-10-CM | POA: Diagnosis not present

## 2023-10-09 DIAGNOSIS — I10 Essential (primary) hypertension: Secondary | ICD-10-CM | POA: Diagnosis not present

## 2023-10-09 DIAGNOSIS — I2510 Atherosclerotic heart disease of native coronary artery without angina pectoris: Secondary | ICD-10-CM | POA: Diagnosis not present

## 2023-10-09 DIAGNOSIS — F419 Anxiety disorder, unspecified: Secondary | ICD-10-CM | POA: Diagnosis not present

## 2023-10-14 ENCOUNTER — Other Ambulatory Visit (HOSPITAL_COMMUNITY): Payer: Self-pay

## 2023-11-07 ENCOUNTER — Other Ambulatory Visit (HOSPITAL_COMMUNITY): Payer: Self-pay

## 2023-11-09 ENCOUNTER — Other Ambulatory Visit (HOSPITAL_COMMUNITY): Payer: Self-pay

## 2023-11-09 ENCOUNTER — Other Ambulatory Visit: Payer: Self-pay

## 2023-11-09 MED ORDER — FLUTICASONE PROPIONATE 50 MCG/ACT NA SUSP
NASAL | 1 refills | Status: AC
  Filled 2023-11-09: qty 16, 60d supply, fill #0
  Filled 2024-10-19: qty 16, 60d supply, fill #1

## 2023-12-15 ENCOUNTER — Other Ambulatory Visit (HOSPITAL_COMMUNITY): Payer: Self-pay

## 2023-12-16 ENCOUNTER — Other Ambulatory Visit: Payer: Self-pay

## 2023-12-16 ENCOUNTER — Other Ambulatory Visit (HOSPITAL_COMMUNITY): Payer: Self-pay

## 2023-12-16 MED ORDER — CLONAZEPAM 1 MG PO TABS
ORAL_TABLET | ORAL | 1 refills | Status: DC
  Filled 2023-12-16 (×2): qty 45, 30d supply, fill #0
  Filled 2024-01-17: qty 45, 30d supply, fill #1

## 2023-12-29 ENCOUNTER — Other Ambulatory Visit: Payer: Self-pay

## 2024-01-04 ENCOUNTER — Other Ambulatory Visit (HOSPITAL_COMMUNITY): Payer: Self-pay

## 2024-01-04 DIAGNOSIS — J449 Chronic obstructive pulmonary disease, unspecified: Secondary | ICD-10-CM | POA: Diagnosis not present

## 2024-01-04 DIAGNOSIS — H938X3 Other specified disorders of ear, bilateral: Secondary | ICD-10-CM | POA: Diagnosis not present

## 2024-01-04 DIAGNOSIS — U071 COVID-19: Secondary | ICD-10-CM | POA: Diagnosis not present

## 2024-01-04 DIAGNOSIS — Z1152 Encounter for screening for COVID-19: Secondary | ICD-10-CM | POA: Diagnosis not present

## 2024-01-04 DIAGNOSIS — R0981 Nasal congestion: Secondary | ICD-10-CM | POA: Diagnosis not present

## 2024-01-04 DIAGNOSIS — F17200 Nicotine dependence, unspecified, uncomplicated: Secondary | ICD-10-CM | POA: Diagnosis not present

## 2024-01-04 DIAGNOSIS — R52 Pain, unspecified: Secondary | ICD-10-CM | POA: Diagnosis not present

## 2024-01-04 DIAGNOSIS — R058 Other specified cough: Secondary | ICD-10-CM | POA: Diagnosis not present

## 2024-01-04 MED ORDER — PROMETHAZINE-DM 6.25-15 MG/5ML PO SYRP
5.0000 mL | ORAL_SOLUTION | Freq: Four times a day (QID) | ORAL | 0 refills | Status: AC | PRN
  Filled 2024-01-04: qty 200, 10d supply, fill #0

## 2024-01-04 MED ORDER — LAGEVRIO 200 MG PO CAPS
4.0000 | ORAL_CAPSULE | Freq: Two times a day (BID) | ORAL | 0 refills | Status: AC
  Filled 2024-01-04: qty 40, 5d supply, fill #0

## 2024-01-04 MED ORDER — AZITHROMYCIN 250 MG PO TABS
ORAL_TABLET | ORAL | 0 refills | Status: AC
  Filled 2024-01-04: qty 6, 5d supply, fill #0

## 2024-01-06 ENCOUNTER — Other Ambulatory Visit (HOSPITAL_COMMUNITY): Payer: Self-pay

## 2024-01-18 ENCOUNTER — Other Ambulatory Visit (HOSPITAL_COMMUNITY): Payer: Self-pay

## 2024-01-18 ENCOUNTER — Other Ambulatory Visit: Payer: Self-pay

## 2024-02-06 ENCOUNTER — Other Ambulatory Visit (HOSPITAL_COMMUNITY): Payer: Self-pay

## 2024-02-21 ENCOUNTER — Other Ambulatory Visit (HOSPITAL_COMMUNITY): Payer: Self-pay

## 2024-02-22 ENCOUNTER — Other Ambulatory Visit (HOSPITAL_COMMUNITY): Payer: Self-pay

## 2024-02-23 ENCOUNTER — Other Ambulatory Visit (HOSPITAL_COMMUNITY): Payer: Self-pay

## 2024-02-23 ENCOUNTER — Encounter: Payer: Self-pay | Admitting: Pharmacist

## 2024-02-23 ENCOUNTER — Other Ambulatory Visit: Payer: Self-pay

## 2024-02-23 MED ORDER — CLONAZEPAM 1 MG PO TABS
1.0000 mg | ORAL_TABLET | Freq: Every evening | ORAL | 0 refills | Status: DC | PRN
  Filled 2024-02-23 – 2024-02-26 (×2): qty 45, 30d supply, fill #0

## 2024-02-26 ENCOUNTER — Other Ambulatory Visit: Payer: Self-pay

## 2024-02-26 ENCOUNTER — Other Ambulatory Visit (HOSPITAL_COMMUNITY): Payer: Self-pay

## 2024-03-03 DIAGNOSIS — L820 Inflamed seborrheic keratosis: Secondary | ICD-10-CM | POA: Diagnosis not present

## 2024-03-03 DIAGNOSIS — D2261 Melanocytic nevi of right upper limb, including shoulder: Secondary | ICD-10-CM | POA: Diagnosis not present

## 2024-03-03 DIAGNOSIS — Z85828 Personal history of other malignant neoplasm of skin: Secondary | ICD-10-CM | POA: Diagnosis not present

## 2024-03-03 DIAGNOSIS — L821 Other seborrheic keratosis: Secondary | ICD-10-CM | POA: Diagnosis not present

## 2024-03-03 DIAGNOSIS — D1801 Hemangioma of skin and subcutaneous tissue: Secondary | ICD-10-CM | POA: Diagnosis not present

## 2024-03-03 DIAGNOSIS — L400 Psoriasis vulgaris: Secondary | ICD-10-CM | POA: Diagnosis not present

## 2024-03-03 DIAGNOSIS — D2272 Melanocytic nevi of left lower limb, including hip: Secondary | ICD-10-CM | POA: Diagnosis not present

## 2024-03-04 ENCOUNTER — Other Ambulatory Visit: Payer: Self-pay

## 2024-03-04 ENCOUNTER — Other Ambulatory Visit (HOSPITAL_COMMUNITY): Payer: Self-pay

## 2024-03-04 MED ORDER — CALCIPOTRIENE 0.005 % EX OINT
TOPICAL_OINTMENT | CUTANEOUS | 11 refills | Status: AC
Start: 2024-03-03 — End: ?
  Filled 2024-03-04: qty 60, 30d supply, fill #0
  Filled 2024-10-19: qty 60, 30d supply, fill #1

## 2024-03-04 MED ORDER — TRIAMCINOLONE ACETONIDE 0.1 % EX CREA
TOPICAL_CREAM | CUTANEOUS | 11 refills | Status: AC
  Filled 2024-03-04: qty 80, 30d supply, fill #0
  Filled 2024-11-14: qty 80, 30d supply, fill #1

## 2024-03-04 MED ORDER — HYDROCORTISONE 2.5 % EX OINT
TOPICAL_OINTMENT | CUTANEOUS | 11 refills | Status: AC
Start: 2024-03-03 — End: ?
  Filled 2024-03-04: qty 60, 30d supply, fill #0

## 2024-03-04 MED ORDER — BETAMETHASONE DIPROPIONATE 0.05 % EX LOTN
1.0000 "application " | TOPICAL_LOTION | Freq: Every day | CUTANEOUS | 11 refills | Status: AC | PRN
  Filled 2024-03-04: qty 60, 30d supply, fill #0
  Filled 2024-11-14: qty 60, 30d supply, fill #1

## 2024-03-05 ENCOUNTER — Other Ambulatory Visit (HOSPITAL_COMMUNITY): Payer: Self-pay

## 2024-03-21 ENCOUNTER — Other Ambulatory Visit (HOSPITAL_COMMUNITY): Payer: Self-pay

## 2024-03-21 DIAGNOSIS — N390 Urinary tract infection, site not specified: Secondary | ICD-10-CM | POA: Diagnosis not present

## 2024-03-21 DIAGNOSIS — Z8551 Personal history of malignant neoplasm of bladder: Secondary | ICD-10-CM | POA: Diagnosis not present

## 2024-03-21 DIAGNOSIS — R300 Dysuria: Secondary | ICD-10-CM | POA: Diagnosis not present

## 2024-03-21 MED ORDER — NITROFURANTOIN MONOHYD MACRO 100 MG PO CAPS
100.0000 mg | ORAL_CAPSULE | Freq: Two times a day (BID) | ORAL | 0 refills | Status: AC
  Filled 2024-03-21: qty 14, 7d supply, fill #0

## 2024-03-23 ENCOUNTER — Other Ambulatory Visit (HOSPITAL_COMMUNITY): Payer: Self-pay

## 2024-03-23 ENCOUNTER — Other Ambulatory Visit: Payer: Self-pay | Admitting: Internal Medicine

## 2024-03-23 ENCOUNTER — Other Ambulatory Visit: Payer: Self-pay

## 2024-03-23 DIAGNOSIS — Z1231 Encounter for screening mammogram for malignant neoplasm of breast: Secondary | ICD-10-CM

## 2024-03-23 MED ORDER — LOSARTAN POTASSIUM 50 MG PO TABS
50.0000 mg | ORAL_TABLET | Freq: Every day | ORAL | 1 refills | Status: DC
  Filled 2024-03-23: qty 90, 90d supply, fill #0
  Filled 2024-06-21: qty 90, 90d supply, fill #1

## 2024-03-23 MED ORDER — CLONAZEPAM 1 MG PO TABS
1.0000 mg | ORAL_TABLET | Freq: Every evening | ORAL | 0 refills | Status: DC | PRN
  Filled 2024-04-04: qty 45, 30d supply, fill #0

## 2024-03-25 ENCOUNTER — Other Ambulatory Visit (HOSPITAL_BASED_OUTPATIENT_CLINIC_OR_DEPARTMENT_OTHER): Payer: Self-pay

## 2024-03-25 MED ORDER — PREDNISONE 5 MG (21) PO TBPK
ORAL_TABLET | ORAL | 0 refills | Status: AC
  Filled 2024-03-25: qty 21, 6d supply, fill #0

## 2024-04-01 DIAGNOSIS — I2510 Atherosclerotic heart disease of native coronary artery without angina pectoris: Secondary | ICD-10-CM | POA: Diagnosis not present

## 2024-04-01 DIAGNOSIS — E039 Hypothyroidism, unspecified: Secondary | ICD-10-CM | POA: Diagnosis not present

## 2024-04-01 DIAGNOSIS — E559 Vitamin D deficiency, unspecified: Secondary | ICD-10-CM | POA: Diagnosis not present

## 2024-04-01 DIAGNOSIS — I10 Essential (primary) hypertension: Secondary | ICD-10-CM | POA: Diagnosis not present

## 2024-04-04 ENCOUNTER — Other Ambulatory Visit (HOSPITAL_COMMUNITY): Payer: Self-pay

## 2024-04-05 ENCOUNTER — Other Ambulatory Visit: Payer: Self-pay

## 2024-04-05 ENCOUNTER — Other Ambulatory Visit (HOSPITAL_COMMUNITY): Payer: Self-pay

## 2024-04-06 ENCOUNTER — Other Ambulatory Visit: Payer: Self-pay

## 2024-04-06 ENCOUNTER — Other Ambulatory Visit (HOSPITAL_COMMUNITY): Payer: Self-pay

## 2024-04-06 MED ORDER — LEVOTHYROXINE SODIUM 88 MCG PO TABS
88.0000 ug | ORAL_TABLET | Freq: Every day | ORAL | 3 refills | Status: AC
Start: 2024-04-06 — End: ?
  Filled 2024-04-06: qty 90, 90d supply, fill #0
  Filled 2024-07-04: qty 90, 90d supply, fill #1
  Filled 2024-09-30: qty 90, 90d supply, fill #2

## 2024-04-06 MED ORDER — LIOTHYRONINE SODIUM 5 MCG PO TABS
10.0000 ug | ORAL_TABLET | Freq: Every day | ORAL | 3 refills | Status: AC
  Filled 2024-04-06: qty 180, 90d supply, fill #0
  Filled 2024-07-04: qty 180, 90d supply, fill #1
  Filled 2024-09-30: qty 180, 90d supply, fill #2

## 2024-04-08 DIAGNOSIS — Z1339 Encounter for screening examination for other mental health and behavioral disorders: Secondary | ICD-10-CM | POA: Diagnosis not present

## 2024-04-08 DIAGNOSIS — Z1331 Encounter for screening for depression: Secondary | ICD-10-CM | POA: Diagnosis not present

## 2024-04-14 ENCOUNTER — Other Ambulatory Visit: Payer: Self-pay

## 2024-04-22 ENCOUNTER — Ambulatory Visit
Admission: RE | Admit: 2024-04-22 | Discharge: 2024-04-22 | Disposition: A | Discharge status: Home / Self Care | Source: Ambulatory Visit | Attending: Internal Medicine | Admitting: Internal Medicine

## 2024-04-22 DIAGNOSIS — Z1231 Encounter for screening mammogram for malignant neoplasm of breast: Secondary | ICD-10-CM | POA: Diagnosis not present

## 2024-04-25 ENCOUNTER — Other Ambulatory Visit (HOSPITAL_COMMUNITY): Payer: Self-pay

## 2024-04-25 ENCOUNTER — Other Ambulatory Visit (HOSPITAL_BASED_OUTPATIENT_CLINIC_OR_DEPARTMENT_OTHER): Payer: Self-pay

## 2024-04-25 MED ORDER — NEOMYCIN-POLYMYXIN-DEXAMETH 3.5-10000-0.1 OP SUSP
1.0000 [drp] | OPHTHALMIC | 0 refills | Status: AC
  Filled 2024-04-25: qty 5, 17d supply, fill #0

## 2024-05-05 ENCOUNTER — Telehealth: Payer: Self-pay | Admitting: Internal Medicine

## 2024-05-05 ENCOUNTER — Other Ambulatory Visit (HOSPITAL_BASED_OUTPATIENT_CLINIC_OR_DEPARTMENT_OTHER): Payer: Self-pay

## 2024-05-05 ENCOUNTER — Other Ambulatory Visit: Payer: Self-pay

## 2024-05-05 ENCOUNTER — Other Ambulatory Visit

## 2024-05-05 DIAGNOSIS — R109 Unspecified abdominal pain: Secondary | ICD-10-CM

## 2024-05-05 LAB — BASIC METABOLIC PANEL WITH GFR
BUN: 9 mg/dL (ref 6–23)
CO2: 25 meq/L (ref 19–32)
Calcium: 9.3 mg/dL (ref 8.4–10.5)
Chloride: 101 meq/L (ref 96–112)
Creatinine, Ser: 0.75 mg/dL (ref 0.40–1.20)
GFR: 85.28 mL/min (ref >60.00)
Glucose, Bld: 110 mg/dL — ABNORMAL HIGH (ref 70–99)
Potassium: 3.9 meq/L (ref 3.5–5.1)
Sodium: 136 meq/L (ref 135–145)

## 2024-05-05 MED ORDER — METRONIDAZOLE 500 MG PO TABS
500.0000 mg | ORAL_TABLET | Freq: Two times a day (BID) | ORAL | 0 refills | Status: AC
  Filled 2024-05-05: qty 20, 10d supply, fill #0

## 2024-05-05 MED ORDER — CIPROFLOXACIN HCL 500 MG PO TABS
500.0000 mg | ORAL_TABLET | Freq: Two times a day (BID) | ORAL | 0 refills | Status: AC
  Filled 2024-05-05: qty 20, 10d supply, fill #0

## 2024-05-05 NOTE — Telephone Encounter (Signed)
 [name], Thanks for the update. Since she has tolerated Cipro  and Flagyl in the past and CTAP is tomorrow (there might be a delay in getting the CT result) pls send RX for Cipro  500mg  one tab po bid and Flagyl 500mg  one tab po bid x 10 days for suspected diverticulitis. Further recommendations to be determined after CTAP results reviewed and as already noted she should go to the ED if she has severe abd pain. [name], I am off work tomorrow so if you could check on the status of her CT and review result with any available POD MD if abnormal. THX.

## 2024-05-05 NOTE — Telephone Encounter (Signed)
Scripts sent to pharmacy, pt aware.

## 2024-05-05 NOTE — Telephone Encounter (Signed)
 Felicia Leonard pt calling stating she thinks she is having a diverticulitis flare. Reports she has been having issues for about a week. States she has low back pain, more on the right, more gas and bloating, constipation where the stool is hard to push out. She has tried a heating pad, resting and that has not helped. She has taken her temp but has felt like she may have a fever. Getting up and down is becoming very painful. Reports the last time she had diverticulitis the pain was in her back.Please advise.

## 2024-05-05 NOTE — Telephone Encounter (Signed)
 Pt aware, she is coming for bmet today, order in epic. Pt scheduled for CT of A/P at [facility] tomorrow. She is to arrive there at 8:45am.  Pt states she has taken cipro  and flagyl in the past ad did fine.

## 2024-05-05 NOTE — Telephone Encounter (Signed)
 Felicia Leonard, in review of her chart, I did not see any recent documented episodes of diverticulitis or recent CT that confirmed diverticulitis. It is good to know she had a colonoscopy 05/2023 which showed diverticulosis and one polyp, nothing worrisome. Since she possibly has a fever and her abdominal/back pain is getting worse, it is appropriate to send her for a stat CTAP with oral and IV contrast. If her pain is severe she should go to the ED. She should go on clear liquids only for the rest of today. Take Miralax once daily. She has multiple antibiotic allergies including PCN (Augmentin), Erythromycin and Sulfa antibiotics. Pls ask her if she's ever had Cipro /Flagyl and if so did she tolerate it. THX.

## 2024-05-05 NOTE — Telephone Encounter (Signed)
 Inbound call from patient stating she believes she is having a diverticultis flare up. States she has been having lower back pain, unusual gas, and constipated at times. States she is sometimes able to have a bowel movement and then feels better but then shortly after symptoms start back. States this has been going on for over a week. Patient is requesting a call back to discuss further. Please advise, thank you

## 2024-05-06 ENCOUNTER — Ambulatory Visit: Payer: Self-pay | Admitting: Nurse Practitioner

## 2024-05-06 ENCOUNTER — Other Ambulatory Visit (HOSPITAL_COMMUNITY): Payer: Self-pay

## 2024-05-06 ENCOUNTER — Ambulatory Visit (HOSPITAL_BASED_OUTPATIENT_CLINIC_OR_DEPARTMENT_OTHER)
Admission: RE | Admit: 2024-05-06 | Discharge: 2024-05-06 | Disposition: A | Discharge status: Home / Self Care | Source: Ambulatory Visit | Attending: Nurse Practitioner

## 2024-05-06 DIAGNOSIS — K5792 Diverticulitis of intestine, part unspecified, without perforation or abscess without bleeding: Secondary | ICD-10-CM | POA: Diagnosis not present

## 2024-05-06 DIAGNOSIS — R109 Unspecified abdominal pain: Secondary | ICD-10-CM | POA: Insufficient documentation

## 2024-05-06 DIAGNOSIS — K5730 Diverticulosis of large intestine without perforation or abscess without bleeding: Secondary | ICD-10-CM | POA: Diagnosis not present

## 2024-05-06 MED ORDER — CLONAZEPAM 1 MG PO TABS
1.0000 mg | ORAL_TABLET | Freq: Every evening | ORAL | 0 refills | Status: DC | PRN
  Filled 2024-05-06: qty 45, 30d supply, fill #0

## 2024-05-06 MED ORDER — IOHEXOL 300 MG/ML  SOLN
75.0000 mL | Freq: Once | INTRAMUSCULAR | Status: AC | PRN
  Administered 2024-05-06: 75 mL via INTRAVENOUS

## 2024-05-07 ENCOUNTER — Other Ambulatory Visit (HOSPITAL_BASED_OUTPATIENT_CLINIC_OR_DEPARTMENT_OTHER): Payer: Self-pay

## 2024-05-09 NOTE — Telephone Encounter (Signed)
 I contacted patient at this time for follow up.  Refer to CTAP with contrast 05/06/2024 result notes.  CTAP showed diverticulosis without evidence of acute diverticulitis and showed diffuse hepatic steatosis. Patient received my prior message regarding his results therefore she did not start Cipro  and Flagyl  as previously prescribed for suspected diverticulitis. She describes having lower back pressure which is not severe and has eased up a bit since she reduce the carbohydrates in her diet over the weekend. I advised the patient to follow with her PCP Dr. Barnetta Liberty to further evaluate her weight her back and to consider urinalysis. She had questions regarding hepatic steatosis and I advised for her to reduce the carbohydrates in her diet i.e.: Bread/pasta/potatoes/rice and sweets, exercise as tolerated and lose weight to reduce the risk of developing fatty liver disease.  She stated having her annual physical with Dr. Jolee Naval about 1 month ago and stated all of her labs were normal.  I recommended hepatic panel once annually.  She will follow-up in our office if she has abdominal pain or if Dr. Jolee Naval is concerned her lower back pain is triggered by an underlying GI issue.

## 2024-06-14 DIAGNOSIS — Z01419 Encounter for gynecological examination (general) (routine) without abnormal findings: Secondary | ICD-10-CM | POA: Diagnosis not present

## 2024-06-14 DIAGNOSIS — Z1272 Encounter for screening for malignant neoplasm of vagina: Secondary | ICD-10-CM | POA: Diagnosis not present

## 2024-06-14 DIAGNOSIS — Z1151 Encounter for screening for human papillomavirus (HPV): Secondary | ICD-10-CM | POA: Diagnosis not present

## 2024-06-14 DIAGNOSIS — Z6828 Body mass index (BMI) 28.0-28.9, adult: Secondary | ICD-10-CM | POA: Diagnosis not present

## 2024-06-21 ENCOUNTER — Other Ambulatory Visit: Payer: Self-pay

## 2024-06-21 ENCOUNTER — Other Ambulatory Visit (HOSPITAL_COMMUNITY): Payer: Self-pay

## 2024-06-21 MED ORDER — AMLODIPINE BESYLATE 10 MG PO TABS
10.0000 mg | ORAL_TABLET | Freq: Every day | ORAL | 1 refills | Status: AC
  Filled 2024-08-06: qty 90, 90d supply, fill #0
  Filled 2024-11-14: qty 90, 90d supply, fill #1

## 2024-06-22 ENCOUNTER — Other Ambulatory Visit (HOSPITAL_COMMUNITY): Payer: Self-pay

## 2024-06-24 ENCOUNTER — Other Ambulatory Visit (HOSPITAL_COMMUNITY): Payer: Self-pay

## 2024-06-24 ENCOUNTER — Encounter (HOSPITAL_BASED_OUTPATIENT_CLINIC_OR_DEPARTMENT_OTHER): Payer: Self-pay

## 2024-06-24 ENCOUNTER — Other Ambulatory Visit: Payer: Self-pay

## 2024-06-24 MED ORDER — CLONAZEPAM 1 MG PO TABS
1.0000 mg | ORAL_TABLET | Freq: Every evening | ORAL | 0 refills | Status: DC | PRN
  Filled 2024-06-24: qty 45, 30d supply, fill #0

## 2024-07-04 ENCOUNTER — Other Ambulatory Visit (HOSPITAL_COMMUNITY): Payer: Self-pay

## 2024-07-05 DIAGNOSIS — R8289 Other abnormal findings on cytological and histological examination of urine: Secondary | ICD-10-CM | POA: Diagnosis not present

## 2024-07-05 DIAGNOSIS — Z8551 Personal history of malignant neoplasm of bladder: Secondary | ICD-10-CM | POA: Diagnosis not present

## 2024-07-08 DIAGNOSIS — Z8551 Personal history of malignant neoplasm of bladder: Secondary | ICD-10-CM | POA: Diagnosis not present

## 2024-07-08 DIAGNOSIS — N329 Bladder disorder, unspecified: Secondary | ICD-10-CM | POA: Diagnosis not present

## 2024-07-12 ENCOUNTER — Telehealth (HOSPITAL_BASED_OUTPATIENT_CLINIC_OR_DEPARTMENT_OTHER): Payer: Self-pay | Admitting: Family

## 2024-07-12 NOTE — Telephone Encounter (Addendum)
 Returned call to patient who had MRI done in June and wants to know if this MRI will suffice for the one Dr. Raford ordered since it mentions aneurysm.  If not she will get it but questioning due to expense.  Let her know I will forward to Dr. Raford for review.

## 2024-07-12 NOTE — Telephone Encounter (Signed)
 Pt has requested that her MRI be viewed when possible and to receive a call regarding issues (if there are)

## 2024-07-13 ENCOUNTER — Other Ambulatory Visit (HOSPITAL_COMMUNITY): Payer: Self-pay

## 2024-07-14 NOTE — Telephone Encounter (Signed)
 She had a CT of the abdomen in June.  She didn't have any chest imaging and I don't see an MRI.  Did she have this done somewhere else?    TCR

## 2024-07-15 ENCOUNTER — Other Ambulatory Visit (HOSPITAL_BASED_OUTPATIENT_CLINIC_OR_DEPARTMENT_OTHER): Payer: Self-pay | Admitting: *Deleted

## 2024-07-15 DIAGNOSIS — I7121 Aneurysm of the ascending aorta, without rupture: Secondary | ICD-10-CM

## 2024-07-15 NOTE — Telephone Encounter (Signed)
 Spoke with patient and explained need for MRA  Order placed and message to scheduling to reach out to patient

## 2024-07-18 ENCOUNTER — Encounter (HOSPITAL_BASED_OUTPATIENT_CLINIC_OR_DEPARTMENT_OTHER): Payer: Self-pay

## 2024-07-21 ENCOUNTER — Other Ambulatory Visit (HOSPITAL_COMMUNITY): Payer: Self-pay

## 2024-07-29 ENCOUNTER — Other Ambulatory Visit (HOSPITAL_COMMUNITY): Payer: Self-pay

## 2024-08-02 ENCOUNTER — Other Ambulatory Visit (HOSPITAL_COMMUNITY): Payer: Self-pay

## 2024-08-04 ENCOUNTER — Other Ambulatory Visit (HOSPITAL_COMMUNITY): Payer: Self-pay

## 2024-08-05 ENCOUNTER — Other Ambulatory Visit (HOSPITAL_COMMUNITY): Payer: Self-pay

## 2024-08-05 ENCOUNTER — Other Ambulatory Visit: Payer: Self-pay

## 2024-08-05 MED ORDER — ONDANSETRON HCL 4 MG PO TABS
4.0000 mg | ORAL_TABLET | Freq: Every day | ORAL | 1 refills | Status: AC | PRN
  Filled 2024-08-05: qty 30, 30d supply, fill #0
  Filled 2024-09-14: qty 30, 30d supply, fill #1

## 2024-08-06 ENCOUNTER — Other Ambulatory Visit (HOSPITAL_COMMUNITY): Payer: Self-pay

## 2024-08-08 ENCOUNTER — Other Ambulatory Visit: Payer: Self-pay

## 2024-08-08 ENCOUNTER — Other Ambulatory Visit (HOSPITAL_COMMUNITY): Payer: Self-pay

## 2024-08-08 ENCOUNTER — Telehealth (HOSPITAL_BASED_OUTPATIENT_CLINIC_OR_DEPARTMENT_OTHER): Payer: Self-pay | Admitting: Cardiovascular Disease

## 2024-08-08 MED ORDER — CLONAZEPAM 1 MG PO TABS
1.0000 mg | ORAL_TABLET | Freq: Every evening | ORAL | 0 refills | Status: DC | PRN
  Filled 2024-08-08: qty 45, 30d supply, fill #0

## 2024-08-08 NOTE — Telephone Encounter (Signed)
 Patient stated she is returning RN's call regarding MRI and the estimate for this test is very high.  Patient stated she should be getting the test at Tier 1 not Tier 3 deductible.  Patient stated can leave voice message.

## 2024-08-09 NOTE — Telephone Encounter (Signed)
 Patient is calling back look for answers. Stated that she will have to cancel the MRI if it going to be billed at a Tier 3 and not a Tier 1. Please advise

## 2024-08-12 ENCOUNTER — Ambulatory Visit (HOSPITAL_BASED_OUTPATIENT_CLINIC_OR_DEPARTMENT_OTHER)
Admission: RE | Admit: 2024-08-12 | Discharge: 2024-08-12 | Disposition: A | Discharge status: Home / Self Care | Source: Ambulatory Visit | Attending: Family | Admitting: Family

## 2024-08-12 DIAGNOSIS — I7121 Aneurysm of the ascending aorta, without rupture: Secondary | ICD-10-CM | POA: Insufficient documentation

## 2024-08-12 MED ORDER — GADOBUTROL 1 MMOL/ML IV SOLN
8.0000 mL | Freq: Once | INTRAVENOUS | Status: AC | PRN
  Administered 2024-08-12: 8 mL via INTRAVENOUS
  Filled 2024-08-12: qty 8

## 2024-08-15 ENCOUNTER — Ambulatory Visit (HOSPITAL_BASED_OUTPATIENT_CLINIC_OR_DEPARTMENT_OTHER): Payer: Self-pay | Admitting: Family

## 2024-08-15 DIAGNOSIS — I7121 Aneurysm of the ascending aorta, without rupture: Secondary | ICD-10-CM

## 2024-08-15 NOTE — Telephone Encounter (Signed)
-----   Message from Reche GORMAN Finder sent at 08/15/2024  7:47 AM EDT ----- Stable ascending aortic aneurysm 4.1cm. Repeat MR Angio Chest in one year for monitoring per radiology guidelines. ----- Message ----- From: Interface, Rad Results In Sent: 08/13/2024   8:06 AM EDT To: Reche GORMAN Finder, NP

## 2024-08-15 NOTE — Telephone Encounter (Signed)
 Repeat order for MR ANGIO CHEST placed in the system for the pt to have done in one year for surveillance.    Will let the pt know this plan when she reviews her results Reche Finder, NP sent her in her mychart account.

## 2024-09-14 ENCOUNTER — Other Ambulatory Visit (HOSPITAL_COMMUNITY): Payer: Self-pay

## 2024-09-15 ENCOUNTER — Other Ambulatory Visit (HOSPITAL_COMMUNITY): Payer: Self-pay

## 2024-09-15 ENCOUNTER — Other Ambulatory Visit: Payer: Self-pay

## 2024-09-15 MED ORDER — CLONAZEPAM 1 MG PO TABS
ORAL_TABLET | ORAL | 0 refills | Status: DC
  Filled 2024-09-15: qty 45, 30d supply, fill #0

## 2024-09-30 ENCOUNTER — Other Ambulatory Visit (HOSPITAL_COMMUNITY): Payer: Self-pay

## 2024-10-03 ENCOUNTER — Other Ambulatory Visit (HOSPITAL_COMMUNITY): Payer: Self-pay

## 2024-10-03 MED ORDER — ZOLPIDEM TARTRATE 10 MG PO TABS
10.0000 mg | ORAL_TABLET | Freq: Every evening | ORAL | 1 refills | Status: AC | PRN
  Filled 2024-10-03: qty 90, 90d supply, fill #0
  Filled 2024-12-28: qty 90, 90d supply, fill #1
  Filled ????-??-??: fill #1

## 2024-10-05 ENCOUNTER — Other Ambulatory Visit (HOSPITAL_COMMUNITY): Payer: Self-pay

## 2024-10-07 DIAGNOSIS — F419 Anxiety disorder, unspecified: Secondary | ICD-10-CM | POA: Diagnosis not present

## 2024-10-07 DIAGNOSIS — G4700 Insomnia, unspecified: Secondary | ICD-10-CM | POA: Diagnosis not present

## 2024-10-07 DIAGNOSIS — I2510 Atherosclerotic heart disease of native coronary artery without angina pectoris: Secondary | ICD-10-CM | POA: Diagnosis not present

## 2024-10-07 DIAGNOSIS — E559 Vitamin D deficiency, unspecified: Secondary | ICD-10-CM | POA: Diagnosis not present

## 2024-10-07 DIAGNOSIS — J449 Chronic obstructive pulmonary disease, unspecified: Secondary | ICD-10-CM | POA: Diagnosis not present

## 2024-10-07 DIAGNOSIS — E039 Hypothyroidism, unspecified: Secondary | ICD-10-CM | POA: Diagnosis not present

## 2024-10-07 DIAGNOSIS — C679 Malignant neoplasm of bladder, unspecified: Secondary | ICD-10-CM | POA: Diagnosis not present

## 2024-10-07 DIAGNOSIS — J45909 Unspecified asthma, uncomplicated: Secondary | ICD-10-CM | POA: Diagnosis not present

## 2024-10-07 DIAGNOSIS — I10 Essential (primary) hypertension: Secondary | ICD-10-CM | POA: Diagnosis not present

## 2024-10-07 DIAGNOSIS — F17200 Nicotine dependence, unspecified, uncomplicated: Secondary | ICD-10-CM | POA: Diagnosis not present

## 2024-10-12 ENCOUNTER — Other Ambulatory Visit (HOSPITAL_BASED_OUTPATIENT_CLINIC_OR_DEPARTMENT_OTHER): Payer: Self-pay

## 2024-10-12 DIAGNOSIS — J029 Acute pharyngitis, unspecified: Secondary | ICD-10-CM | POA: Diagnosis not present

## 2024-10-12 DIAGNOSIS — R0981 Nasal congestion: Secondary | ICD-10-CM | POA: Diagnosis not present

## 2024-10-12 DIAGNOSIS — R058 Other specified cough: Secondary | ICD-10-CM | POA: Diagnosis not present

## 2024-10-12 DIAGNOSIS — Z1152 Encounter for screening for COVID-19: Secondary | ICD-10-CM | POA: Diagnosis not present

## 2024-10-12 DIAGNOSIS — J209 Acute bronchitis, unspecified: Secondary | ICD-10-CM | POA: Diagnosis not present

## 2024-10-12 DIAGNOSIS — R5383 Other fatigue: Secondary | ICD-10-CM | POA: Diagnosis not present

## 2024-10-12 DIAGNOSIS — J449 Chronic obstructive pulmonary disease, unspecified: Secondary | ICD-10-CM | POA: Diagnosis not present

## 2024-10-12 MED ORDER — PREDNISONE 20 MG PO TABS
ORAL_TABLET | ORAL | 0 refills | Status: AC
  Filled 2024-10-12: qty 9, 6d supply, fill #0

## 2024-10-12 MED ORDER — AZITHROMYCIN 250 MG PO TABS
ORAL_TABLET | ORAL | 0 refills | Status: AC
  Filled 2024-10-12: qty 6, 5d supply, fill #0

## 2024-10-12 MED ORDER — PROMETHAZINE-DM 6.25-15 MG/5ML PO SYRP
5.0000 mL | ORAL_SOLUTION | Freq: Three times a day (TID) | ORAL | 0 refills | Status: AC | PRN
Start: 2024-10-12 — End: ?
  Filled 2024-10-12: qty 150, 10d supply, fill #0

## 2024-10-19 ENCOUNTER — Other Ambulatory Visit: Payer: Self-pay

## 2024-10-19 ENCOUNTER — Other Ambulatory Visit (HOSPITAL_COMMUNITY): Payer: Self-pay

## 2024-10-19 ENCOUNTER — Other Ambulatory Visit (HOSPITAL_BASED_OUTPATIENT_CLINIC_OR_DEPARTMENT_OTHER): Payer: Self-pay | Admitting: Family

## 2024-10-19 DIAGNOSIS — E785 Hyperlipidemia, unspecified: Secondary | ICD-10-CM

## 2024-10-19 DIAGNOSIS — I25118 Atherosclerotic heart disease of native coronary artery with other forms of angina pectoris: Secondary | ICD-10-CM

## 2024-10-19 MED ORDER — LOSARTAN POTASSIUM 50 MG PO TABS
50.0000 mg | ORAL_TABLET | Freq: Every day | ORAL | 1 refills | Status: AC
  Filled 2024-10-19: qty 90, 90d supply, fill #0

## 2024-10-19 MED ORDER — ALBUTEROL SULFATE HFA 108 (90 BASE) MCG/ACT IN AERS
1.0000 | INHALATION_SPRAY | RESPIRATORY_TRACT | 3 refills | Status: AC | PRN
  Filled 2024-10-19: qty 6.7, 30d supply, fill #0

## 2024-10-19 MED ORDER — ROSUVASTATIN CALCIUM 5 MG PO TABS
5.0000 mg | ORAL_TABLET | Freq: Every day | ORAL | 0 refills | Status: AC
  Filled 2024-10-19: qty 30, 30d supply, fill #0

## 2024-10-19 MED ORDER — CLONAZEPAM 1 MG PO TABS
1.0000 mg | ORAL_TABLET | Freq: Every evening | ORAL | 0 refills | Status: AC | PRN
  Filled 2024-10-19: qty 45, 45d supply, fill #0

## 2024-10-21 ENCOUNTER — Other Ambulatory Visit (HOSPITAL_COMMUNITY): Payer: Self-pay

## 2024-10-21 ENCOUNTER — Other Ambulatory Visit: Payer: Self-pay

## 2024-10-24 ENCOUNTER — Other Ambulatory Visit (HOSPITAL_COMMUNITY): Payer: Self-pay

## 2024-10-24 MED ORDER — DOXYCYCLINE HYCLATE 100 MG PO CAPS
100.0000 mg | ORAL_CAPSULE | Freq: Two times a day (BID) | ORAL | 0 refills | Status: AC
  Filled 2024-10-24 (×2): qty 14, 7d supply, fill #0

## 2024-10-24 MED ORDER — PREDNISONE 20 MG PO TABS
20.0000 mg | ORAL_TABLET | Freq: Every day | ORAL | 0 refills | Status: AC
  Filled 2024-10-24 (×2): qty 5, 5d supply, fill #0

## 2024-11-14 ENCOUNTER — Other Ambulatory Visit (HOSPITAL_BASED_OUTPATIENT_CLINIC_OR_DEPARTMENT_OTHER): Payer: Self-pay | Admitting: Family

## 2024-11-14 ENCOUNTER — Other Ambulatory Visit (HOSPITAL_COMMUNITY): Payer: Self-pay

## 2024-11-14 DIAGNOSIS — I25118 Atherosclerotic heart disease of native coronary artery with other forms of angina pectoris: Secondary | ICD-10-CM

## 2024-11-14 DIAGNOSIS — E785 Hyperlipidemia, unspecified: Secondary | ICD-10-CM

## 2024-11-15 ENCOUNTER — Other Ambulatory Visit (HOSPITAL_COMMUNITY): Payer: Self-pay

## 2024-11-15 ENCOUNTER — Other Ambulatory Visit: Payer: Self-pay

## 2024-11-15 MED ORDER — CLONAZEPAM 1 MG PO TABS
ORAL_TABLET | ORAL | 0 refills | Status: AC
  Filled 2024-11-15 – 2024-11-18 (×3): qty 45, 30d supply, fill #0

## 2024-11-18 ENCOUNTER — Other Ambulatory Visit (HOSPITAL_COMMUNITY): Payer: Self-pay

## 2024-11-18 ENCOUNTER — Other Ambulatory Visit: Payer: Self-pay

## 2024-12-28 ENCOUNTER — Other Ambulatory Visit (HOSPITAL_BASED_OUTPATIENT_CLINIC_OR_DEPARTMENT_OTHER): Payer: Self-pay | Admitting: Family

## 2024-12-28 ENCOUNTER — Other Ambulatory Visit (HOSPITAL_COMMUNITY): Payer: Self-pay

## 2024-12-28 DIAGNOSIS — E785 Hyperlipidemia, unspecified: Secondary | ICD-10-CM

## 2024-12-28 DIAGNOSIS — I25118 Atherosclerotic heart disease of native coronary artery with other forms of angina pectoris: Secondary | ICD-10-CM

## 2024-12-30 ENCOUNTER — Other Ambulatory Visit: Payer: Self-pay

## 2024-12-30 ENCOUNTER — Other Ambulatory Visit (HOSPITAL_COMMUNITY): Payer: Self-pay

## 2024-12-30 MED ORDER — CLONAZEPAM 1 MG PO TABS
ORAL_TABLET | ORAL | 0 refills | Status: AC
  Filled 2024-12-30: qty 45, 30d supply, fill #0

## 2024-12-30 MED ORDER — MUPIROCIN 2 % EX OINT
TOPICAL_OINTMENT | CUTANEOUS | 1 refills | Status: AC
  Filled 2024-12-30: qty 22, 5d supply, fill #0
